# Patient Record
Sex: Male | Born: 1980 | Race: White | Hispanic: No | Marital: Married | Smoking: Never smoker
Health system: Southern US, Community
[De-identification: ages and names within clinical notes are randomized; demographics above are authoritative.]

## PROBLEM LIST (undated history)

## (undated) DIAGNOSIS — Q430 Meckel's diverticulum (displaced) (hypertrophic): Secondary | ICD-10-CM

## (undated) DIAGNOSIS — K56609 Unspecified intestinal obstruction, unspecified as to partial versus complete obstruction: Secondary | ICD-10-CM

## (undated) HISTORY — PX: LASIK: SHX215

## (undated) HISTORY — PX: ARTHROSCOPY KNEE W/ DRILLING: SUR92

---

## 2005-04-13 ENCOUNTER — Encounter: Admission: RE | Admit: 2005-04-13 | Discharge: 2005-04-13 | Payer: Self-pay | Admitting: Emergency Medicine

## 2009-12-18 ENCOUNTER — Encounter: Admission: RE | Admit: 2009-12-18 | Discharge: 2009-12-18 | Payer: Self-pay | Admitting: Family Medicine

## 2010-11-13 ENCOUNTER — Emergency Department (HOSPITAL_COMMUNITY)
Admission: EM | Admit: 2010-11-13 | Discharge: 2010-11-13 | Disposition: A | Discharge status: Home / Self Care | Payer: 59 | Attending: Emergency Medicine | Admitting: Emergency Medicine

## 2010-11-13 DIAGNOSIS — S01501A Unspecified open wound of lip, initial encounter: Secondary | ICD-10-CM | POA: Insufficient documentation

## 2010-11-13 DIAGNOSIS — S01119A Laceration without foreign body of unspecified eyelid and periocular area, initial encounter: Secondary | ICD-10-CM | POA: Insufficient documentation

## 2010-11-13 DIAGNOSIS — S0100XA Unspecified open wound of scalp, initial encounter: Secondary | ICD-10-CM | POA: Insufficient documentation

## 2010-11-13 DIAGNOSIS — IMO0002 Reserved for concepts with insufficient information to code with codable children: Secondary | ICD-10-CM | POA: Insufficient documentation

## 2010-11-13 DIAGNOSIS — Z23 Encounter for immunization: Secondary | ICD-10-CM | POA: Insufficient documentation

## 2010-11-13 DIAGNOSIS — Y9229 Other specified public building as the place of occurrence of the external cause: Secondary | ICD-10-CM | POA: Insufficient documentation

## 2011-11-26 ENCOUNTER — Ambulatory Visit: Payer: 59 | Admitting: Family Medicine

## 2011-11-26 VITALS — BP 118/82 | HR 46 | Temp 98.0°F | Resp 12 | Ht 68.5 in | Wt 225.6 lb

## 2011-11-26 DIAGNOSIS — R5381 Other malaise: Secondary | ICD-10-CM

## 2011-11-26 DIAGNOSIS — R110 Nausea: Secondary | ICD-10-CM

## 2011-11-26 LAB — POCT CBC
Granulocyte percent: 75.8 %G (ref 37–80)
Hemoglobin: 16.1 g/dL (ref 14.1–18.1)
MCH, POC: 29.2 pg (ref 27–31.2)
MCV: 90 fL (ref 80–97)
MID (cbc): 0.6 (ref 0–0.9)
MPV: 8 fL (ref 0–99.8)
POC LYMPH PERCENT: 16.9 %L (ref 10–50)
RBC: 5.52 M/uL (ref 4.69–6.13)
RDW, POC: 13.2 %

## 2011-11-26 LAB — POCT INFLUENZA A/B: Influenza B, POC: NEGATIVE

## 2011-11-26 MED ORDER — ONDANSETRON HCL 8 MG PO TABS: 8.0000 mg | ORAL_TABLET | Freq: Three times a day (TID) | ORAL | Status: AC | PRN

## 2011-11-26 NOTE — Progress Notes (Signed)
[facility] [address] [place] [address] [phone]  Date:  11/26/2011   Name:  KLAY SOBOTKA   DOB:  May 13, 1980   MRN:  [id number]  PCP:  No primary provider on file.    Chief Complaint: Generalized Body Aches, Emesis, Headache, Diarrhea, Sinusitis and Chills   History of Present Illness:  Luis Pineda is a 31 y.o. very pleasant male patient who presents with the following:  Today is Sunday- on Wednesday he developed body aches, chills and sweats.  He did vomit yesterday while at work.  Diarrhea yesterday and today.  Feels nauseated and it is hard to eat.   No cough or mucus from his nose, but he does note congestion and pressure/ frontal HA.  No ST.  He is afraid that he might have the flu.   No dysuria or other urinary symptoms.   His wife is 7 months pregnant.  He wants to be sure he is not contagious. They are expecting a baby girl.    Work in [employer].  He has never been a smoker.   Took ibuprofen last night but nothing today.    There is no problem list on file for this patient.   No past medical history on file.  No past surgical history on file.  History  Substance Use Topics  . Smoking status: Never Smoker   . Smokeless tobacco: Not on file  . Alcohol Use: Not on file    No family history on file.  No Known Allergies  Medication list has been reviewed and updated.  No current outpatient prescriptions on file prior to visit.    Review of Systems:  As per HPI- otherwise negative.   Physical Examination: Filed Vitals:   11/26/11 1315  BP: 118/82  Pulse: 46  Temp: 98 F (36.7 C)  Resp: 12   Filed Vitals:   11/26/11 1315  Height: 5' 8.5" (1.74 m)  Weight: 225 lb 9.6 oz (102.331 kg)   Body mass index is 33.80 kg/(m^2). Ideal Body Weight: Weight in (lb) to have BMI = 25: 166.5   GEN: WDWN, NAD, Non-toxic, A & O x 3, muscular build HEENT: Atraumatic, Normocephalic. Neck supple. No masses, No LAD.  TM and  oropharynx wnl, PEERL, EOMI.   Ears and Nose: No external deformity. CV: RRR, No M/G/R. No JVD. No thrill. No extra heart sounds. PULM: CTA B, no wheezes, crackles, rhonchi. No retractions. No resp. distress. No accessory muscle use. ABD: S, ND, +BS. No rebound. No HSM.  Mild TTP which is non- specific- is is diffuse over his entire abdomen but of note is not present in the RLQ EXTR: No c/c/e NEURO Normal gait.  PSYCH: Normally interactive. Conversant. Not depressed or anxious appearing.  Calm demeanor.   Results for orders placed in visit on 11/26/11  POCT CBC      Component Value Range   WBC 7.6  4.6 - 10.2 K/uL   Lymph, poc 1.3  0.6 - 3.4   POC LYMPH PERCENT 16.9  10 - 50 %L   MID (cbc) 0.6  0 - 0.9   POC MID % 7.3  0 - 12 %M   POC Granulocyte 5.8  2 - 6.9   Granulocyte percent 75.8  37 - 80 %G   RBC 5.52  4.69 - 6.13 M/uL   Hemoglobin 16.1  14.1 - 18.1 g/dL   HCT, POC 65.7  84.6 - 53.7 %  MCV 90.0  80 - 97 fL   MCH, POC 29.2  27 - 31.2 pg   MCHC 32.4  31.8 - 35.4 g/dL   RDW, POC 13.0     Platelet Count, POC 192  142 - 424 K/uL   MPV 8.0  0 - 99.8 fL  POCT INFLUENZA A/B      Component Value Range   Influenza A, POC Negative     Influenza B, POC Negative      Assessment and Plan: 1. Malaise  POCT CBC, POCT Influenza A/B  2. Nausea  ondansetron (ZOFRAN) 8 MG tablet   Malaise- likely a viral infection.  Does not seem to have influenza.  Zofran to use as needed for nausea.  Rest, fluids.  Let me know if not better in the next couple of days- Sooner if worse.     Abbe Amsterdam, MD

## 2015-03-10 ENCOUNTER — Ambulatory Visit (INDEPENDENT_AMBULATORY_CARE_PROVIDER_SITE_OTHER): Payer: 59 | Admitting: Urgent Care

## 2015-03-10 VITALS — BP 140/92 | HR 64 | Temp 97.5°F | Resp 18 | Ht 68.75 in | Wt 234.0 lb

## 2015-03-10 DIAGNOSIS — J302 Other seasonal allergic rhinitis: Secondary | ICD-10-CM | POA: Diagnosis not present

## 2015-03-10 DIAGNOSIS — J029 Acute pharyngitis, unspecified: Secondary | ICD-10-CM | POA: Diagnosis not present

## 2015-03-10 DIAGNOSIS — R0982 Postnasal drip: Secondary | ICD-10-CM

## 2015-03-10 DIAGNOSIS — R05 Cough: Secondary | ICD-10-CM

## 2015-03-10 DIAGNOSIS — R059 Cough, unspecified: Secondary | ICD-10-CM

## 2015-03-10 DIAGNOSIS — J329 Chronic sinusitis, unspecified: Secondary | ICD-10-CM

## 2015-03-10 DIAGNOSIS — J019 Acute sinusitis, unspecified: Secondary | ICD-10-CM | POA: Diagnosis not present

## 2015-03-10 MED ORDER — PSEUDOEPHEDRINE HCL ER 120 MG PO TB12: 120.0000 mg | ORAL_TABLET | Freq: Two times a day (BID) | ORAL | Status: DC

## 2015-03-10 MED ORDER — HYDROCODONE-HOMATROPINE 5-1.5 MG/5ML PO SYRP: 5.0000 mL | ORAL_SOLUTION | Freq: Every evening | ORAL | Status: DC | PRN

## 2015-03-10 MED ORDER — CETIRIZINE HCL 10 MG PO TABS: 10.0000 mg | ORAL_TABLET | Freq: Every day | ORAL | Status: DC

## 2015-03-10 MED ORDER — BENZONATATE 100 MG PO CAPS: 100.0000 mg | ORAL_CAPSULE | Freq: Three times a day (TID) | ORAL | Status: DC | PRN

## 2015-03-10 MED ORDER — AMOXICILLIN 875 MG PO TABS: 875.0000 mg | ORAL_TABLET | Freq: Two times a day (BID) | ORAL | Status: DC

## 2015-03-10 NOTE — Progress Notes (Signed)
    MRN: [id number] DOB: 10-May-1980  Subjective:   Luis Pineda is a 34 y.o. male presenting for chief complaint of Generalized Body Aches; Nasal Congestion; and Cough  Reports 4 day history of body aches, chills, sinus congestion, sinus pressure, hoarseness, productive cough worse at night, neck pain, intermittent loose stools. Has tried NyQuil, DayQuil and Mucinex with minimal relief. Of note, patient has 2 children and a wife that have been intermittently ill, ear infections over the past 4 weeks. Patient also works with [employer] and has been on 80 hour weeks since 01/2015. Denies fever, itchy or red eyes, sinus pain, ear pain, ear drainage, sore throat, tooth pain, chest pain, shob, chest tightness, n/v, abdominal pain. Has history of seasonal allergies (Spring), takes antihistamines for this. Denies history of asthma, smoking cigarettes. Family history is negative for diabetes, heart disease, cancer.  Abdulsalam currently has no medications in their medication list. Also has No Known Allergies.  Con  has no past medical history on file. Also  has no past surgical history on file.  Objective:   Vitals: BP 140/92 mmHg  Pulse 64  Temp(Src) 97.5 F (36.4 C) (Oral)  Resp 18  Ht 5' 8.75" (1.746 m)  Wt 234 lb (106.142 kg)  BMI 34.82 kg/m2  SpO2 98%  Physical Exam  Constitutional: He is oriented to person, place, and time. He appears well-developed and well-nourished.  HENT:  TM's flat bilaterally, no effusions or erythema. Nasal turbinates boggy with slight yellow mucus. Bilateral maxillary sinus tenderness. Moderate postnasal drip present, without oropharyngeal exudates, erythema or abscesses.  Eyes: Right eye exhibits no discharge. Left eye exhibits no discharge. No scleral icterus.  Neck: Normal range of motion. Neck supple.  Cardiovascular: Normal rate, regular rhythm and intact distal pulses.  Exam reveals no gallop and no friction rub.   No murmur heard. Pulmonary/Chest: No  respiratory distress. He has no wheezes. He has no rales.  Lymphadenopathy:    He has cervical adenopathy (bilateral, anterior).  Neurological: He is alert and oriented to person, place, and time.  Skin: Skin is warm and dry. No rash noted. No erythema. No pallor.   Assessment and Plan :   1. Acute sinusitis, recurrence not specified, unspecified location 2. Sore throat 3. Cough - Patient likely undergoing viral sinusitis due to lack of rest and exposure to multiple sick contacts. I advised supportive care which the patient did not agree with. However, I offered patient a printed script for amoxicillin in case he has no improvement with supportive care including Hycodan, Tessalon, Sudafed, otc NSAID. I offered patient note for work but stated that he cannot rest/take sick days from work. He is going to wait 3-4 days before filling Amoxicillin.  - Counseled patient on risks of antibiotic use in setting of loose stools. Patient verbalized understanding.  4. Post-nasal drainage 5. Seasonal allergies - Start Zyrtec, may also use Sudafed.  Wallis Bamberg, PA-C [facility] [phone] 03/10/2015 10:24 AM

## 2015-03-10 NOTE — Patient Instructions (Signed)

## 2015-08-20 DIAGNOSIS — K56609 Unspecified intestinal obstruction, unspecified as to partial versus complete obstruction: Secondary | ICD-10-CM

## 2015-08-20 HISTORY — DX: Unspecified intestinal obstruction, unspecified as to partial versus complete obstruction: K56.609

## 2015-08-28 ENCOUNTER — Other Ambulatory Visit: Payer: Self-pay | Admitting: Family Medicine

## 2015-08-28 ENCOUNTER — Inpatient Hospital Stay: Admit: 2015-08-28 | Payer: Self-pay | Admitting: Diagnostic Radiology

## 2015-08-28 ENCOUNTER — Inpatient Hospital Stay (HOSPITAL_COMMUNITY)
Admission: AD | Admit: 2015-08-28 | Discharge: 2015-09-01 | DRG: 386 | Disposition: A | Discharge status: Home / Self Care | Payer: 59 | Source: Ambulatory Visit | Attending: Family Medicine | Admitting: Family Medicine

## 2015-08-28 ENCOUNTER — Encounter (HOSPITAL_COMMUNITY): Payer: Self-pay | Admitting: Family Medicine

## 2015-08-28 ENCOUNTER — Inpatient Hospital Stay
Admission: AD | Admit: 2015-08-28 | Discharge: 2015-08-28 | Disposition: A | Discharge status: Home / Self Care | Payer: 59 | Source: Ambulatory Visit | Attending: Family Medicine | Admitting: Family Medicine

## 2015-08-28 DIAGNOSIS — K5090 Crohn's disease, unspecified, without complications: Secondary | ICD-10-CM | POA: Diagnosis present

## 2015-08-28 DIAGNOSIS — K529 Noninfective gastroenteritis and colitis, unspecified: Secondary | ICD-10-CM | POA: Diagnosis present

## 2015-08-28 DIAGNOSIS — R112 Nausea with vomiting, unspecified: Secondary | ICD-10-CM | POA: Diagnosis present

## 2015-08-28 DIAGNOSIS — R1031 Right lower quadrant pain: Secondary | ICD-10-CM

## 2015-08-28 DIAGNOSIS — K50019 Crohn's disease of small intestine with unspecified complications: Secondary | ICD-10-CM | POA: Diagnosis not present

## 2015-08-28 DIAGNOSIS — K50012 Crohn's disease of small intestine with intestinal obstruction: Principal | ICD-10-CM | POA: Diagnosis present

## 2015-08-28 DIAGNOSIS — K50912 Crohn's disease, unspecified, with intestinal obstruction: Secondary | ICD-10-CM | POA: Diagnosis not present

## 2015-08-28 DIAGNOSIS — G43A0 Cyclical vomiting, not intractable: Secondary | ICD-10-CM | POA: Diagnosis not present

## 2015-08-28 DIAGNOSIS — R1084 Generalized abdominal pain: Secondary | ICD-10-CM

## 2015-08-28 DIAGNOSIS — Z6831 Body mass index (BMI) 31.0-31.9, adult: Secondary | ICD-10-CM

## 2015-08-28 DIAGNOSIS — Z8719 Personal history of other diseases of the digestive system: Secondary | ICD-10-CM | POA: Diagnosis present

## 2015-08-28 DIAGNOSIS — E669 Obesity, unspecified: Secondary | ICD-10-CM | POA: Diagnosis present

## 2015-08-28 DIAGNOSIS — K56609 Unspecified intestinal obstruction, unspecified as to partial versus complete obstruction: Secondary | ICD-10-CM | POA: Diagnosis present

## 2015-08-28 DIAGNOSIS — K5669 Other intestinal obstruction: Secondary | ICD-10-CM | POA: Diagnosis not present

## 2015-08-28 HISTORY — DX: Unspecified intestinal obstruction, unspecified as to partial versus complete obstruction: K56.609

## 2015-08-28 LAB — CBC WITH DIFFERENTIAL/PLATELET
BASOS ABS: 0 10*3/uL (ref 0.0–0.1)
BASOS PCT: 0 %
EOS ABS: 0.1 10*3/uL (ref 0.0–0.7)
EOS PCT: 1 %
HCT: 43.8 % (ref 39.0–52.0)
Hemoglobin: 14.8 g/dL (ref 13.0–17.0)
Lymphocytes Relative: 26 %
Lymphs Abs: 1.8 10*3/uL (ref 0.7–4.0)
MCH: 28.7 pg (ref 26.0–34.0)
MCHC: 33.8 g/dL (ref 30.0–36.0)
MCV: 84.9 fL (ref 78.0–100.0)
MONO ABS: 0.8 10*3/uL (ref 0.1–1.0)
Monocytes Relative: 11 %
NEUTROS ABS: 4.2 10*3/uL (ref 1.7–7.7)
Neutrophils Relative %: 62 %
PLATELETS: 219 10*3/uL (ref 150–400)
RBC: 5.16 MIL/uL (ref 4.22–5.81)
RDW: 12.3 % (ref 11.5–15.5)
WBC: 6.8 10*3/uL (ref 4.0–10.5)

## 2015-08-28 LAB — COMPREHENSIVE METABOLIC PANEL
ALBUMIN: 3.4 g/dL — AB (ref 3.5–5.0)
ALT: 14 U/L — ABNORMAL LOW (ref 17–63)
ANION GAP: 5 (ref 5–15)
AST: 14 U/L — AB (ref 15–41)
Alkaline Phosphatase: 65 U/L (ref 38–126)
BUN: 9 mg/dL (ref 6–20)
CHLORIDE: 101 mmol/L (ref 101–111)
CO2: 27 mmol/L (ref 22–32)
Calcium: 9.1 mg/dL (ref 8.9–10.3)
Creatinine, Ser: 1.28 mg/dL — ABNORMAL HIGH (ref 0.61–1.24)
GFR calc Af Amer: >60 mL/min (ref >60)
GFR calc non Af Amer: >60 mL/min (ref >60)
GLUCOSE: 119 mg/dL — AB (ref 65–99)
POTASSIUM: 3.3 mmol/L — AB (ref 3.5–5.1)
SODIUM: 133 mmol/L — AB (ref 135–145)
Total Bilirubin: 0.4 mg/dL (ref 0.3–1.2)
Total Protein: 6.3 g/dL — ABNORMAL LOW (ref 6.5–8.1)

## 2015-08-28 LAB — URINALYSIS, ROUTINE W REFLEX MICROSCOPIC
Bilirubin Urine: NEGATIVE
Glucose, UA: NEGATIVE mg/dL
Hgb urine dipstick: NEGATIVE
KETONES UR: NEGATIVE mg/dL
LEUKOCYTES UA: NEGATIVE
NITRITE: NEGATIVE
PH: 6 (ref 5.0–8.0)
Protein, ur: NEGATIVE mg/dL
SPECIFIC GRAVITY, URINE: 1.029 (ref 1.005–1.030)

## 2015-08-28 LAB — LACTIC ACID, PLASMA: Lactic Acid, Venous: 0.8 mmol/L (ref 0.5–2.0)

## 2015-08-28 LAB — SEDIMENTATION RATE: Sed Rate: 14 mm/hr (ref 0–16)

## 2015-08-28 LAB — C-REACTIVE PROTEIN: CRP: 1.5 mg/dL — ABNORMAL HIGH (ref <1.0)

## 2015-08-28 LAB — TSH: TSH: 0.662 u[IU]/mL (ref 0.350–4.500)

## 2015-08-28 MED ORDER — SODIUM CHLORIDE 0.9 % IV SOLN
INTRAVENOUS | Status: DC
  Administered 2015-08-28 – 2015-08-30 (×6): via INTRAVENOUS

## 2015-08-28 MED ORDER — PIPERACILLIN-TAZOBACTAM 3.375 G IVPB
3.3750 g | Freq: Three times a day (TID) | INTRAVENOUS | Status: DC
Start: 2015-08-28 — End: 2015-09-01
  Administered 2015-08-28 – 2015-09-01 (×12): 3.375 g via INTRAVENOUS
  Filled 2015-08-28 (×14): qty 50

## 2015-08-28 MED ORDER — CETYLPYRIDINIUM CHLORIDE 0.05 % MT LIQD
7.0000 mL | Freq: Two times a day (BID) | OROMUCOSAL | Status: DC
  Administered 2015-08-29 – 2015-08-30 (×4): 7 mL via OROMUCOSAL

## 2015-08-28 MED ORDER — IOPAMIDOL (ISOVUE-300) INJECTION 61%
125.0000 mL | Freq: Once | INTRAVENOUS | Status: AC | PRN
  Administered 2015-08-28: 125 mL via INTRAVENOUS

## 2015-08-28 MED ORDER — MORPHINE SULFATE (PF) 2 MG/ML IV SOLN
2.0000 mg | INTRAVENOUS | Status: DC | PRN
  Administered 2015-08-28 – 2015-08-30 (×5): 2 mg via INTRAVENOUS
  Filled 2015-08-28 (×7): qty 1

## 2015-08-28 MED ORDER — CIPROFLOXACIN IN D5W 400 MG/200ML IV SOLN
400.0000 mg | Freq: Two times a day (BID) | INTRAVENOUS | Status: DC
  Filled 2015-08-28 (×2): qty 200

## 2015-08-28 MED ORDER — ONDANSETRON HCL 4 MG PO TABS: 4.0000 mg | ORAL_TABLET | Freq: Four times a day (QID) | ORAL | Status: DC | PRN

## 2015-08-28 MED ORDER — ONDANSETRON HCL 4 MG/2ML IJ SOLN: 4.0000 mg | Freq: Four times a day (QID) | INTRAMUSCULAR | Status: DC | PRN

## 2015-08-28 MED ORDER — METRONIDAZOLE IN NACL 5-0.79 MG/ML-% IV SOLN
500.0000 mg | Freq: Three times a day (TID) | INTRAVENOUS | Status: DC
  Filled 2015-08-28 (×2): qty 100

## 2015-08-28 NOTE — H&P (Signed)
History and Physical  Luis Pineda TGP:[id number] DOB: Apr 21, 1980 DOA: 08/28/2015  Referring physician: Thora Lance, MD PCP: Thora Lance, MD   Chief Complaint: abdominal pain  HPI: Luis Pineda is a 35 y.o. male with no known significant PMH presents as direct admit from PCP office with diagnosis of abdominal pain and SBO.  The patient reports having progressively worsening abdominal pain for past 2 weeks.  He has been having intermittent sharp, crampy mid-epigastric and RLQ pain that forced him to leave work and go to the doctor.  His symptoms started with bloating and mild pain that he thought was related to constipation. He was taking laxatives at home but symptoms not getting better. He had bowel movements up until the last couple of days and then was not able to have a bowel movement.  He began to have nausea and vomiting in last 24 hours. He was seen by PCP today and got a CT scan of the abdomen revealing small bowel obstruction and suspicion of enteritis.  He is being admitted for further evaluation and management.   Review of Systems: All systems reviewed and apart from history of presenting illness, are negative.  History reviewed. No pertinent past medical history. Past Surgical History  Procedure Laterality Date  . Lasik    . Arthroscopy knee w/ drilling Right    Social History:  reports that he has never smoked. He does not have any smokeless tobacco history on file. His alcohol and drug histories are not on file.   No Known Allergies  Family History  Problem Relation Age of Onset  . GI problems     Prior to Admission medications   Not on File  Denies taking any medications at this time.   Physical Exam: Filed Vitals:   08/28/15 1622  BP: 140/88  Pulse: 80  Temp: 98.1 F (36.7 C)  TempSrc: Oral  Resp: 18  Height: 5\' 10"  (1.778 m)  Weight: 218 lb (98.884 kg)  SpO2: 99%     General exam: well developed and nourished patient, lying comfortably  supine on the bed in no obvious distress but does appear ill.  Head, eyes and ENT: Nontraumatic and normocephalic. Pupils equally reacting to light and accommodation. Oral mucosa dry.  Neck: Supple. No JVD, carotid bruit or thyromegaly.  Lymphatics: No lymphadenopathy.  Respiratory system: Clear to auscultation. No increased work of breathing.  Cardiovascular system: S1 and S2 heard, RRR. No JVD, murmurs, gallops, clicks or pedal edema.  Gastrointestinal system: Abdomen is nondistended but very tender to palpation in RLQ and epigastric areas, some tenderness noted in LLQ and hypoactive bowel sounds heard. No organomegaly or masses appreciated.  Central nervous system: Alert and oriented. No focal neurological deficits.  Extremities: Symmetric 5 x 5 power. Peripheral pulses symmetrically felt.   Skin: No rashes or acute findings.  Musculoskeletal system: Negative exam.  Psychiatry: Pleasant and cooperative.  Labs on Admission:  Basic Metabolic Panel: No results for input(s): NA, K, CL, CO2, GLUCOSE, BUN, CREATININE, CALCIUM, MG, PHOS in the last 168 hours. Liver Function Tests: No results for input(s): AST, ALT, ALKPHOS, BILITOT, PROT, ALBUMIN in the last 168 hours. No results for input(s): LIPASE, AMYLASE in the last 168 hours. No results for input(s): AMMONIA in the last 168 hours. CBC: No results for input(s): WBC, NEUTROABS, HGB, HCT, MCV, PLT in the last 168 hours. Cardiac Enzymes: No results for input(s): CKTOTAL, CKMB, CKMBINDEX, TROPONINI in the last 168 hours.  BNP (last 3  results) No results for input(s): PROBNP in the last 8760 hours. CBG: No results for input(s): GLUCAP in the last 168 hours.  Radiological Exams on Admission: Ct Abdomen Pelvis W Contrast  08/28/2015  CLINICAL DATA:  Epigastric pain EXAM: CT ABDOMEN AND PELVIS WITH CONTRAST TECHNIQUE: Multidetector CT imaging of the abdomen and pelvis was performed using the standard protocol following bolus  administration of intravenous contrast. CONTRAST:  ISOVUE-300 IOPAMIDOL (ISOVUE-300) INJECTION 61% COMPARISON:  12/18/2009 FINDINGS: Lower chest: The lung bases are clear. No pleural or pericardial effusion noted. Hepatobiliary: No suspicious liver abnormalities identified. The gallbladder appears normal. There is no biliary dilatation. Pancreas: No mass, inflammatory changes, or other significant abnormality. Spleen: Within normal limits in size and appearance. Adrenals/Urinary Tract: The adrenal glands are normal. Unremarkable appearance of both kidneys. The urinary bladder is within normal limits. Stomach/Bowel: The stomach appears normal. The proximal small bowel loops are unremarkable. There is increase caliber of the mid small bowel loops which measure up to 3.5 cm. Transition point is within the central abdomen where there is an abnormal loop of small bowel which exhibits abnormal mucosal enhanced and wall thickening with surrounding inflammation/ fat stranding. There is associated luminal narrowing. The affected right lower quadrant bowel loops appeared tethered, image 74 series 2. 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 Small lymph nodes are identified within the surrounding mesenteric fat. Bowel wall thickness measures up to 8 mm, image 34 of series 3. Decreased caliber of the terminal ileum. Unremarkable appearance of the colon. Vascular/Lymphatic: Normal appearance of the abdominal aorta. No enlarged retroperitoneal or mesenteric adenopathy. No enlarged pelvic or inguinal lymph nodes. Reproductive: No mass or other significant abnormality. Other: There is a moderate amount of free fluid identified within the pelvis. At this time there are no fluid collections which would be amendable to drainage. Musculoskeletal: There is a well defined sclerotic focus within the right iliac bone. This  has a nonaggressive appearance and likely reflects benign abnormality.   IMPRESSION: 1. Small bowel obstruction. The transition point is within the right lower quadrant of the abdomen, likely mid to distal ileum. 2. Abnormal appearance of right lower quadrant small bowel loops which exhibit wall thickening, mucosal enhanced, luminal narrowing, and surrounding inflammation. Findings are worrisome for scratch set findings are compatible with enteritis. This may be inflammatory or infectious in etiology. Crohn's disease is not excluded. 3. Extraluminal collection of gas and phlegmon with surrounding inflammation is identified within the right lower quadrant and is adjacent to the abnormal appearing small bowel loops. Although this may reflect bowel perforation, in the setting of inflammatory bowel disease this would be indicative of penetrating disease. These results were called by telephone at the time of interpretation on 08/28/2015 at 2:17 pm to Dr. Blair Heys , who verbally acknowledged these results. Electronically Signed   By: Signa Kell M.D.   On: 08/28/2015 14:19   Assessment/Plan Principal Problem:   SBO (small bowel obstruction) (HCC) Active Problems:   Generalized abdominal pain   RLQ abdominal pain   Enteritis   Nausea and vomiting  1. Small Bowel Obstruction - Pt is being admitted to medical bed and made NPO.  He will be started on IVF hydration.  IV pain and nausea meds as needed.  Consult to general surgery being made.  Place NG tube to low wall suction.  Bowel Rest.  Further recommendations pending surgery consult.  Zosyn IV ordered.  2. Enteritis - concerning for an infectious versus inflammatory etiology - ordered to check CBC with diff, lactic acid, CMP, sed rate, CRP and will empirically start antibiotics pending further tests.  Check blood cultures.  I called and requested a GI consult with [facility].   3. Nausea and voming - NG tube placed, IV nausea medications ordered as  needed.    Time spent: 50 mins  Standley Dakins, MD [facility] Pager [phone]  If 7PM-7AM, please contact night-coverage [email or web address] Password [email or web address] 08/28/2015, 5:07 PM

## 2015-08-28 NOTE — Progress Notes (Signed)
Luis Pineda is a 35 y.o. male patient admitted from ED awake, alert - oriented  X 4 - no acute distress noted.  VSS - Blood pressure 140/88, pulse 80, temperature 98.1 F (36.7 C), temperature source Oral, resp. rate 18, height 5\' 10"  (1.778 m), weight 98.884 kg (218 lb), SpO2 99 %.    IV in place, occlusive dsg intact without redness.  Orientation to room, and floor completed.  Admission INP armband ID verified with patient/family, and in place.   SR up x 2, fall assessment complete, with patient and family able to verbalize understanding of risk associated with falls, and verbalized understanding to call nsg before up out of bed.  Call light within reach, patient able to voice, and demonstrate understanding.  Skin, clean-dry- intact without evidence of bruising, or skin tears.   No evidence of skin break down noted on exam.     Will cont to eval and treat per MD orders.  Alonza Bogus, RN 08/28/2015 4:32 PM

## 2015-08-28 NOTE — Progress Notes (Signed)
Direct Admit Signout Note  08/28/2015 71:94 PM  35 year old gentleman with no significant PMH being direct admitted from Dr. Randel Books office with persistent abdominal pain.  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  He is noted to be nontoxic and vitals stable in office.  He needs GI and gen surgery consult.  Admitted to med-surg bed.    Maryln Manuel, MD

## 2015-08-28 NOTE — Consult Note (Signed)
Reason for Consult:SBO, possible Crohn's Referring Physician: Clanford Zakir Pineda is an 35 y.o. male.  HPI: Luis Pineda has been having abdominal pain with bloating for approximately 2 weeks. It gradually got worse. He developed nausea and vomiting yesterday evening. Bowel movements have been daily but very small after straining. No blood in his stool. No recent hematochezia or mucus in stools. He was evaluated by an urgent care facility and referred for CT scan of the abdomen and pelvis as an outpatient. Results demonstrated small bowel obstruction with terminal ileitis and likely localized microperforation. He was referred for direct admission to the hospitalist service by Dr. Jeb Pineda, his primary care physician.  Interestingly, Luis Pineda reports a similar episode, though not as severe in 2011. He underwent CT scan evaluation at that time which he reports was unremarkable.  Past Medical History  Diagnosis Date  . SBO (small bowel obstruction) ([facility]) 08/2015    Past Surgical History  Procedure Laterality Date  . Lasik    . Arthroscopy knee w/ drilling Right     Family History  Problem Relation Age of Onset  . GI problems      Social History:  reports that he has never smoked. He has never used smokeless tobacco. He reports that he drinks alcohol. He reports that he does not use illicit drugs. He works as a Librarian, academic for [employer] and is married with 2 young children  Allergies: No Known Allergies  Medications:  Continuous: . sodium chloride      Results for orders placed or performed during the hospital encounter of 08/28/15 (from the past 48 hour(s))  CBC with Differential/Platelet     Status: None   Collection Time: 08/28/15  5:02 PM  Result Value Ref Range   WBC 6.8 4.0 - 10.5 K/uL   RBC 5.16 4.22 - 5.81 MIL/uL   Hemoglobin 14.8 13.0 - 17.0 g/dL   HCT 43.8 39.0 - 52.0 %   MCV 84.9 78.0 - 100.0 fL   MCH 28.7 26.0 - 34.0 pg   MCHC 33.8 30.0 - 36.0 g/dL   RDW 12.3  11.5 - 15.5 %   Platelets 219 150 - 400 K/uL   Neutrophils Relative % 62 %   Neutro Abs 4.2 1.7 - 7.7 K/uL   Lymphocytes Relative 26 %   Lymphs Abs 1.8 0.7 - 4.0 K/uL   Monocytes Relative 11 %   Monocytes Absolute 0.8 0.1 - 1.0 K/uL   Eosinophils Relative 1 %   Eosinophils Absolute 0.1 0.0 - 0.7 K/uL   Basophils Relative 0 %   Basophils Absolute 0.0 0.0 - 0.1 K/uL  Comprehensive metabolic panel     Status: Abnormal   Collection Time: 08/28/15  5:02 PM  Result Value Ref Range   Sodium 133 (L) 135 - 145 mmol/L   Potassium 3.3 (L) 3.5 - 5.1 mmol/L   Chloride 101 101 - 111 mmol/L   CO2 27 22 - 32 mmol/L   Glucose, Bld 119 (H) 65 - 99 mg/dL   BUN 9 6 - 20 mg/dL   Creatinine, Ser 1.28 (H) 0.61 - 1.24 mg/dL   Calcium 9.1 8.9 - 10.3 mg/dL   Total Protein 6.3 (L) 6.5 - 8.1 g/dL   Albumin 3.4 (L) 3.5 - 5.0 g/dL   AST 14 (L) 15 - 41 U/L   ALT 14 (L) 17 - 63 U/L   Alkaline Phosphatase 65 38 - 126 U/L   Total Bilirubin 0.4 0.3 - 1.2 mg/dL  GFR calc non Af Amer >60 >60 mL/min   GFR calc Af Amer >60 >60 mL/min    Comment: (NOTE) The eGFR has been calculated using the CKD EPI equation. This calculation has not been validated in all clinical situations. eGFR's persistently <60 mL/min signify possible Chronic Kidney Disease.    Anion gap 5 5 - 15  Sedimentation rate     Status: None   Collection Time: 08/28/15  5:02 PM  Result Value Ref Range   Sed Rate 14 0 - 16 mm/hr    Ct Abdomen Pelvis W Contrast  08/28/2015  CLINICAL DATA:  Epigastric pain EXAM: CT ABDOMEN AND PELVIS WITH CONTRAST TECHNIQUE: Multidetector CT imaging of the abdomen and pelvis was performed using the standard protocol following bolus administration of intravenous contrast. CONTRAST:  14m ISOVUE-300 IOPAMIDOL (ISOVUE-300) INJECTION 61% COMPARISON:  12/18/2009 FINDINGS: Lower chest: The lung bases are clear. No pleural or pericardial effusion noted. Hepatobiliary: No suspicious liver abnormalities identified. The  gallbladder appears normal. There is no biliary dilatation. Pancreas: No mass, inflammatory changes, or other significant abnormality. Spleen: Within normal limits in size and appearance. Adrenals/Urinary Tract: The adrenal glands are normal. Unremarkable appearance of both kidneys. The urinary bladder is within normal limits. Stomach/Bowel: The stomach appears normal. The proximal small bowel loops are unremarkable. There is increase caliber of the mid small bowel loops which measure up to 3.5 cm. Transition point is within the central abdomen where there is an abnormal loop of small bowel which exhibits abnormal mucosal enhanced and wall thickening with surrounding inflammation/ fat stranding. There is associated luminal narrowing. The affected right lower quadrant bowel loops appeared tethered, image 74 series 2. 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 Small lymph nodes are identified within the surrounding mesenteric fat. Bowel wall thickness measures up to 8 mm, image 34 of series 3. Decreased caliber of the terminal ileum. Unremarkable appearance of the colon. Vascular/Lymphatic: Normal appearance of the abdominal aorta. No enlarged retroperitoneal or mesenteric adenopathy. No enlarged pelvic or inguinal lymph nodes. Reproductive: No mass or other significant abnormality. Other: There is a moderate amount of free fluid identified within the pelvis. At this time there are no fluid collections which would be amendable to drainage. Musculoskeletal: There is a well defined sclerotic focus within the right iliac bone. This has a nonaggressive appearance and likely reflects benign abnormality. IMPRESSION: 1. Small bowel obstruction. The transition point is within the right lower quadrant of the abdomen, likely mid to distal ileum. 2. Abnormal appearance of right lower quadrant small bowel loops which  exhibit wall thickening, mucosal enhanced, luminal narrowing, and surrounding inflammation. Findings are worrisome for scratch set findings are compatible with enteritis. This may be inflammatory or infectious in etiology. Crohn's disease is not excluded. 3. Extraluminal collection of gas and phlegmon with surrounding inflammation is identified within the right lower quadrant and is adjacent to the abnormal appearing small bowel loops. Although this may reflect bowel perforation, in the setting of inflammatory bowel disease this would be indicative of penetrating disease. These results were called by telephone at the time of interpretation on 08/28/2015 at 2:17 pm to Dr. RGaynelle Arabian, who verbally acknowledged these results. Electronically Signed   By: TKerby MoorsM.D.   On: 08/28/2015 14:19    Review of Systems  Constitutional: Negative for fever and chills.  HENT: Negative.   Eyes: Negative for blurred vision.  Respiratory: Negative for cough and shortness of breath.   Cardiovascular: Negative for chest pain.  Gastrointestinal: Positive for nausea, vomiting, abdominal pain and constipation. Negative for diarrhea, blood in stool and melena.  Genitourinary: Negative.   Musculoskeletal: Negative.   Skin: Negative.   Neurological: Negative.   Psychiatric/Behavioral: Negative.    Blood pressure 140/88, pulse 80, temperature 98.1 F (36.7 C), temperature source Oral, resp. rate 18, height '5\' 10"'  (1.778 m), weight 98.884 kg (218 lb), SpO2 99 %. Physical Exam  Constitutional: He is oriented to person, place, and time. He appears well-developed and well-nourished. No distress.  HENT:  Head: Normocephalic and atraumatic.  Right Ear: External ear normal.  Left Ear: External ear normal.  Nose: Nose normal.  Mouth/Throat: Oropharynx is clear and moist.  Eyes: EOM are normal. Pupils are equal, round, and reactive to light. Right eye exhibits no discharge. Left eye exhibits no discharge.  Neck:  Neck supple. No tracheal deviation present.  Cardiovascular: Normal rate, normal heart sounds and intact distal pulses.   Respiratory: Effort normal and breath sounds normal. No stridor. No respiratory distress. He has no wheezes. He has no rales.  GI: Soft. He exhibits no distension. There is tenderness. There is no rebound and no guarding.  Hypoactive bowel sounds, tender in the right lower quadrant and left side without guarding, no peritonitis  Musculoskeletal: Normal range of motion. He exhibits no edema or tenderness.  Neurological: He is alert and oriented to person, place, and time. He exhibits normal muscle tone.  Skin: Skin is warm and dry.  Psychiatric: He has a normal mood and affect.    Assessment/Plan: Small bowel obstruction likely due to to Crohn's disease with terminal ileitis and microperforation - agree with IV fluids, Zosyn, bowel rest. Agree with NG tube if he continues to vomit. He needs a gastroenterology consultation for further workup of inflammatory bowel disease. No emergent surgery needed at this time, but we will follow closely. I discussed this with Dr. Wynetta Emery.  Eulises Kijowski E 08/28/2015, 6:24 PM

## 2015-08-29 LAB — CBC
HCT: 41.8 % (ref 39.0–52.0)
HEMOGLOBIN: 13.9 g/dL (ref 13.0–17.0)
MCH: 28.4 pg (ref 26.0–34.0)
MCHC: 33.3 g/dL (ref 30.0–36.0)
MCV: 85.3 fL (ref 78.0–100.0)
Platelets: 213 10*3/uL (ref 150–400)
RBC: 4.9 MIL/uL (ref 4.22–5.81)
RDW: 12.4 % (ref 11.5–15.5)
WBC: 7 10*3/uL (ref 4.0–10.5)

## 2015-08-29 LAB — COMPREHENSIVE METABOLIC PANEL
ALBUMIN: 3.1 g/dL — AB (ref 3.5–5.0)
ALK PHOS: 55 U/L (ref 38–126)
ALT: 11 U/L — ABNORMAL LOW (ref 17–63)
ANION GAP: 7 (ref 5–15)
AST: 12 U/L — ABNORMAL LOW (ref 15–41)
BILIRUBIN TOTAL: 0.6 mg/dL (ref 0.3–1.2)
BUN: 8 mg/dL (ref 6–20)
CALCIUM: 8.8 mg/dL — AB (ref 8.9–10.3)
CO2: 27 mmol/L (ref 22–32)
Chloride: 106 mmol/L (ref 101–111)
Creatinine, Ser: 1.32 mg/dL — ABNORMAL HIGH (ref 0.61–1.24)
GFR calc Af Amer: >60 mL/min (ref >60)
GLUCOSE: 92 mg/dL (ref 65–99)
POTASSIUM: 3.7 mmol/L (ref 3.5–5.1)
Sodium: 140 mmol/L (ref 135–145)
TOTAL PROTEIN: 5.9 g/dL — AB (ref 6.5–8.1)

## 2015-08-29 MED ORDER — METHYLPREDNISOLONE SODIUM SUCC 125 MG IJ SOLR
60.0000 mg | Freq: Two times a day (BID) | INTRAMUSCULAR | Status: DC
  Administered 2015-08-29 – 2015-08-31 (×5): 60 mg via INTRAVENOUS
  Filled 2015-08-29 (×5): qty 2

## 2015-08-29 NOTE — Progress Notes (Signed)
[facility] Progress Note  08/29/2015  Subjective: Pt reports no change in abd pain but not vomiting. Did not have to get NG tube.   Objective:  Vital signs in last 24 hours: Filed Vitals:   08/28/15 1622 08/28/15 2142 08/29/15 0539  BP: 140/88 135/71 110/54  Pulse: 80 84 52  Temp: 98.1 F (36.7 C) 98.3 F (36.8 C) 97.6 F (36.4 C)  TempSrc: Oral Oral Oral  Resp: 18 17 17   Height: 5\' 10"  (1.778 m)    Weight: 218 lb (98.884 kg)    SpO2: 99% 98% 98%   Weight change:   Intake/Output Summary (Last 24 hours) at 08/29/15 1227 Last data filed at 08/29/15 0900  Gross per 24 hour  Intake 1489.17 ml  Output    250 ml  Net 1239.17 ml   No results found for: HGBA1C Lab Results  Component Value Date   CREATININE 1.32* 08/29/2015    Review of Systems As above, otherwise all reviewed and reported negative  Physical Exam  General exam: well developed and nourished patient, lying comfortably supine on the bed in no obvious distress but does appear ill.  Head, eyes and ENT: Nontraumatic and normocephalic. Pupils equally reacting to light and accommodation. Oral mucosa dry.  Neck: Supple. No JVD, carotid bruit or thyromegaly.  Lymphatics: No lymphadenopathy.  Respiratory system: Clear to auscultation. No increased work of breathing.  Cardiovascular system: S1 and S2 heard, RRR. No JVD, murmurs, gallops, clicks or pedal edema.  Gastrointestinal system: Abdomen is nondistended but very tender to palpation in RLQ and epigastric areas, some tenderness noted in LLQ and hypoactive bowel sounds heard. No organomegaly or masses appreciated.  Central nervous system: Alert and oriented. No focal neurological deficits.  Extremities: Symmetric 5 x 5 power. Peripheral pulses symmetrically felt.   Skin: No rashes or acute findings.  Musculoskeletal system: Negative exam.  Psychiatry: Pleasant and cooperative.  Lab Results: Results for orders placed or performed during the  hospital encounter of 08/28/15 (from the past 24 hour(s))  CBC with Differential/Platelet     Status: None   Collection Time: 08/28/15  5:02 PM  Result Value Ref Range   WBC 6.8 4.0 - 10.5 K/uL   RBC 5.16 4.22 - 5.81 MIL/uL   Hemoglobin 14.8 13.0 - 17.0 g/dL   HCT 16.1 09.6 - 04.5 %   MCV 84.9 78.0 - 100.0 fL   MCH 28.7 26.0 - 34.0 pg   MCHC 33.8 30.0 - 36.0 g/dL   RDW 40.9 81.1 - 91.4 %   Platelets 219 150 - 400 K/uL   Neutrophils Relative % 62 %   Neutro Abs 4.2 1.7 - 7.7 K/uL   Lymphocytes Relative 26 %   Lymphs Abs 1.8 0.7 - 4.0 K/uL   Monocytes Relative 11 %   Monocytes Absolute 0.8 0.1 - 1.0 K/uL   Eosinophils Relative 1 %   Eosinophils Absolute 0.1 0.0 - 0.7 K/uL   Basophils Relative 0 %   Basophils Absolute 0.0 0.0 - 0.1 K/uL  Comprehensive metabolic panel     Status: Abnormal   Collection Time: 08/28/15  5:02 PM  Result Value Ref Range   Sodium 133 (L) 135 - 145 mmol/L   Potassium 3.3 (L) 3.5 - 5.1 mmol/L   Chloride 101 101 - 111 mmol/L   CO2 27 22 - 32 mmol/L   Glucose, Bld 119 (H) 65 - 99 mg/dL   BUN 9 6 - 20 mg/dL   Creatinine, Ser 7.82 (H) 0.61 -  1.24 mg/dL   Calcium 9.1 8.9 - 57.8 mg/dL   Total Protein 6.3 (L) 6.5 - 8.1 g/dL   Albumin 3.4 (L) 3.5 - 5.0 g/dL   AST 14 (L) 15 - 41 U/L   ALT 14 (L) 17 - 63 U/L   Alkaline Phosphatase 65 38 - 126 U/L   Total Bilirubin 0.4 0.3 - 1.2 mg/dL   GFR calc non Af Amer >60 >60 mL/min   GFR calc Af Amer >60 >60 mL/min   Anion gap 5 5 - 15  Lactic acid, plasma     Status: None   Collection Time: 08/28/15  5:02 PM  Result Value Ref Range   Lactic Acid, Venous 0.8 0.5 - 2.0 mmol/L  Sedimentation rate     Status: None   Collection Time: 08/28/15  5:02 PM  Result Value Ref Range   Sed Rate 14 0 - 16 mm/hr  TSH     Status: None   Collection Time: 08/28/15  5:02 PM  Result Value Ref Range   TSH 0.662 0.350 - 4.500 uIU/mL  Urinalysis, Routine w reflex microscopic (not at [facility])     Status: None   Collection Time: 08/28/15   5:45 PM  Result Value Ref Range   Color, Urine YELLOW YELLOW   APPearance CLEAR CLEAR   Specific Gravity, Urine 1.029 1.005 - 1.030   pH 6.0 5.0 - 8.0   Glucose, UA NEGATIVE NEGATIVE mg/dL   Hgb urine dipstick NEGATIVE NEGATIVE   Bilirubin Urine NEGATIVE NEGATIVE   Ketones, ur NEGATIVE NEGATIVE mg/dL   Protein, ur NEGATIVE NEGATIVE mg/dL   Nitrite NEGATIVE NEGATIVE   Leukocytes, UA NEGATIVE NEGATIVE  C-reactive protein     Status: Abnormal   Collection Time: 08/28/15  6:21 PM  Result Value Ref Range   CRP 1.5 (H) <1.0 mg/dL  Comprehensive metabolic panel     Status: Abnormal   Collection Time: 08/29/15  4:19 AM  Result Value Ref Range   Sodium 140 135 - 145 mmol/L   Potassium 3.7 3.5 - 5.1 mmol/L   Chloride 106 101 - 111 mmol/L   CO2 27 22 - 32 mmol/L   Glucose, Bld 92 65 - 99 mg/dL   BUN 8 6 - 20 mg/dL   Creatinine, Ser 4.69 (H) 0.61 - 1.24 mg/dL   Calcium 8.8 (L) 8.9 - 10.3 mg/dL   Total Protein 5.9 (L) 6.5 - 8.1 g/dL   Albumin 3.1 (L) 3.5 - 5.0 g/dL   AST 12 (L) 15 - 41 U/L   ALT 11 (L) 17 - 63 U/L   Alkaline Phosphatase 55 38 - 126 U/L   Total Bilirubin 0.6 0.3 - 1.2 mg/dL   GFR calc non Af Amer >60 >60 mL/min   GFR calc Af Amer >60 >60 mL/min   Anion gap 7 5 - 15  CBC     Status: None   Collection Time: 08/29/15  4:19 AM  Result Value Ref Range   WBC 7.0 4.0 - 10.5 K/uL   RBC 4.90 4.22 - 5.81 MIL/uL   Hemoglobin 13.9 13.0 - 17.0 g/dL   HCT 62.9 52.8 - 41.3 %   MCV 85.3 78.0 - 100.0 fL   MCH 28.4 26.0 - 34.0 pg   MCHC 33.3 30.0 - 36.0 g/dL   RDW 24.4 01.0 - 27.2 %   Platelets 213 150 - 400 K/uL    Micro Results: No results found for this or any previous visit (from the past 240 hour(s)).  Medications:  Scheduled Meds: . antiseptic oral rinse  7 mL Mouth Rinse BID  . piperacillin-tazobactam (ZOSYN)  IV  3.375 g Intravenous Q8H   Continuous Infusions: . sodium chloride 125 mL/hr at 08/29/15 1111   PRN Meds:.morphine injection, ondansetron **OR**  ondansetron (ZOFRAN) IV  Assessment/Plan:  1. Small Bowel Obstruction - Pt is being admitted to medical bed and made NPO. He will be started on IVF hydration. IV pain and nausea meds as needed. Consult to general surgery being made. Place NG tube to low wall suction. Bowel Rest. Further recommendations pending surgery consult. Zosyn IV ordered.  2. Enteritis - concerning for an infectious versus inflammatory etiology -continue antibiotics. Follow surg and GI recommendations.  3. Nausea and voming - resolved now.  IV nausea medications ordered as needed.   LOS: 1 day   Laniesha Das 08/29/2015, 12:27 PM  Rodney Langton, MD FAAFP [facility] [place], [place]  Digital Pager [phone]

## 2015-08-29 NOTE — Progress Notes (Signed)
Initial Nutrition Assessment  DOCUMENTATION CODES:   Obesity unspecified  INTERVENTION:  - Diet advancement as medically feasible - RD will continue to monitor for needs, especially with diet advancement.  NUTRITION DIAGNOSIS:   Inadequate oral intake related to inability to eat as evidenced by NPO status.  GOAL:   Patient will meet greater than or equal to 90% of their needs  MONITOR:   Diet advancement, Weight trends, Labs, I & O's  REASON FOR ASSESSMENT:   Malnutrition Screening Tool    ASSESSMENT:   35 y.o. male with no known significant PMH presents as direct admit from PCP office with diagnosis of abdominal pain and SBO. The patient reports having progressively worsening abdominal pain for past 2 weeks. He has been having intermittent sharp, crampy mid-epigastric and RLQ pain that forced him to leave work and go to the doctor. His symptoms started with bloating and mild pain that he thought was related to constipation. He was taking laxatives at home but symptoms not getting better. He had bowel movements up until the last couple of days and then was not able to have a bowel movement. He began to have nausea and vomiting in last 24 hours. He was seen by PCP today and got a CT scan of the abdomen revealing small bowel obstruction and suspicion of enteritis.  Pt seen for MST. BMI indicates obesity. Pt has been NPO since admission with CT showing SBO. Previous plan for NGT placement; placement now being held with no vomiting. Pt reports abdominal pain x2 weeks PTA but that it has been worse in the past  4-5 days. He states that on Tuesday (6/6) pain became much worse than it previously had been. On 6/7 he had his last meal for lunch: grilled chicken salad. On 6/8 he was only able to consume water and half of a granola bar and later in the day developed nausea and had an episode of emesis.   Pt reports that abdominal pain truly began after Memorial Day weekend and he first  thought it was related to eating too much and eating foods he typically does not. Pt reports he tries to eat healthy most of the time.  No muscle or fat wasting present. Per review, pt has lost 16 lbs (7% body weight) in the past 7 months which is not significant for time frame. Weight loss was intentional with a change in eating habits.   Not able to meet needs at this time. Medications reviewed. IVF: NS @ 125 mL/hr. Lab reviewed; creatinine elevated and trending up, Ca: 8.8 mg/dL.    Diet Order:  Diet NPO time specified Except for: Ice Chips, Sips with Meds  Skin:  Reviewed, no issues  Last BM:  6/9  Height:   Ht Readings from Last 1 Encounters:  08/28/15 5\' 10"  (1.778 m)    Weight:   Wt Readings from Last 1 Encounters:  08/28/15 218 lb (98.884 kg)    Ideal Body Weight:  75.45 kg (kg)  BMI:  Body mass index is 31.28 kg/(m^2).  Estimated Nutritional Needs:   Kcal:  1900-2100  Protein:  95-105 grams  Fluid:  >/= 2 L/day  EDUCATION NEEDS:   No education needs identified at this time     Trenton Gammon, RD, LDN Inpatient Clinical Dietitian Pager # [phone] After hours/weekend pager # [phone]

## 2015-08-29 NOTE — Progress Notes (Signed)
SBO (small bowel obstruction) ([facility])  Subjective: Pt feels about the same.  Still with RLQ pain, no fevers  Objective: Vital signs in last 24 hours: Temp:  [97.6 F (36.4 C)-98.3 F (36.8 C)] 97.6 F (36.4 C) (06/10 0539) Pulse Rate:  [52-84] 52 (06/10 0539) Resp:  [17-18] 17 (06/10 0539) BP: (110-140)/(54-88) 110/54 mmHg (06/10 0539) SpO2:  [98 %-99 %] 98 % (06/10 0539) Weight:  [98.884 kg (218 lb)] 98.884 kg (218 lb) (06/09 1622) Last BM Date: 08/28/15  Intake/Output from previous day: 06/09 0701 - 06/10 0700 In: 1429.2 [I.V.:1429.2] Out: 250 [Urine:250] Intake/Output this shift:   General appearance: alert and cooperative GI: abnormal findings:  mild TTP in RLQ, non-distended  Lab Results:  Results for orders placed or performed during the hospital encounter of 08/28/15 (from the past 24 hour(s))  CBC with Differential/Platelet     Status: None   Collection Time: 08/28/15  5:02 PM  Result Value Ref Range   WBC 6.8 4.0 - 10.5 K/uL   RBC 5.16 4.22 - 5.81 MIL/uL   Hemoglobin 14.8 13.0 - 17.0 g/dL   HCT 95.2 84.1 - 32.4 %   MCV 84.9 78.0 - 100.0 fL   MCH 28.7 26.0 - 34.0 pg   MCHC 33.8 30.0 - 36.0 g/dL   RDW 40.1 02.7 - 25.3 %   Platelets 219 150 - 400 K/uL   Neutrophils Relative % 62 %   Neutro Abs 4.2 1.7 - 7.7 K/uL   Lymphocytes Relative 26 %   Lymphs Abs 1.8 0.7 - 4.0 K/uL   Monocytes Relative 11 %   Monocytes Absolute 0.8 0.1 - 1.0 K/uL   Eosinophils Relative 1 %   Eosinophils Absolute 0.1 0.0 - 0.7 K/uL   Basophils Relative 0 %   Basophils Absolute 0.0 0.0 - 0.1 K/uL  Comprehensive metabolic panel     Status: Abnormal   Collection Time: 08/28/15  5:02 PM  Result Value Ref Range   Sodium 133 (L) 135 - 145 mmol/L   Potassium 3.3 (L) 3.5 - 5.1 mmol/L   Chloride 101 101 - 111 mmol/L   CO2 27 22 - 32 mmol/L   Glucose, Bld 119 (H) 65 - 99 mg/dL   BUN 9 6 - 20 mg/dL   Creatinine, Ser 6.64 (H) 0.61 - 1.24 mg/dL   Calcium 9.1 8.9 - 40.3 mg/dL   Total Protein  6.3 (L) 6.5 - 8.1 g/dL   Albumin 3.4 (L) 3.5 - 5.0 g/dL   AST 14 (L) 15 - 41 U/L   ALT 14 (L) 17 - 63 U/L   Alkaline Phosphatase 65 38 - 126 U/L   Total Bilirubin 0.4 0.3 - 1.2 mg/dL   GFR calc non Af Amer >60 >60 mL/min   GFR calc Af Amer >60 >60 mL/min   Anion gap 5 5 - 15  Lactic acid, plasma     Status: None   Collection Time: 08/28/15  5:02 PM  Result Value Ref Range   Lactic Acid, Venous 0.8 0.5 - 2.0 mmol/L  Sedimentation rate     Status: None   Collection Time: 08/28/15  5:02 PM  Result Value Ref Range   Sed Rate 14 0 - 16 mm/hr  TSH     Status: None   Collection Time: 08/28/15  5:02 PM  Result Value Ref Range   TSH 0.662 0.350 - 4.500 uIU/mL  Urinalysis, Routine w reflex microscopic (not at [facility])     Status: None   Collection  Time: 08/28/15  5:45 PM  Result Value Ref Range   Color, Urine YELLOW YELLOW   APPearance CLEAR CLEAR   Specific Gravity, Urine 1.029 1.005 - 1.030   pH 6.0 5.0 - 8.0   Glucose, UA NEGATIVE NEGATIVE mg/dL   Hgb urine dipstick NEGATIVE NEGATIVE   Bilirubin Urine NEGATIVE NEGATIVE   Ketones, ur NEGATIVE NEGATIVE mg/dL   Protein, ur NEGATIVE NEGATIVE mg/dL   Nitrite NEGATIVE NEGATIVE   Leukocytes, UA NEGATIVE NEGATIVE  C-reactive protein     Status: Abnormal   Collection Time: 08/28/15  6:21 PM  Result Value Ref Range   CRP 1.5 (H) <1.0 mg/dL  Comprehensive metabolic panel     Status: Abnormal   Collection Time: 08/29/15  4:19 AM  Result Value Ref Range   Sodium 140 135 - 145 mmol/L   Potassium 3.7 3.5 - 5.1 mmol/L   Chloride 106 101 - 111 mmol/L   CO2 27 22 - 32 mmol/L   Glucose, Bld 92 65 - 99 mg/dL   BUN 8 6 - 20 mg/dL   Creatinine, Ser 4.97 (H) 0.61 - 1.24 mg/dL   Calcium 8.8 (L) 8.9 - 10.3 mg/dL   Total Protein 5.9 (L) 6.5 - 8.1 g/dL   Albumin 3.1 (L) 3.5 - 5.0 g/dL   AST 12 (L) 15 - 41 U/L   ALT 11 (L) 17 - 63 U/L   Alkaline Phosphatase 55 38 - 126 U/L   Total Bilirubin 0.6 0.3 - 1.2 mg/dL   GFR calc non Af Amer >60 >60 mL/min    GFR calc Af Amer >60 >60 mL/min   Anion gap 7 5 - 15  CBC     Status: None   Collection Time: 08/29/15  4:19 AM  Result Value Ref Range   WBC 7.0 4.0 - 10.5 K/uL   RBC 4.90 4.22 - 5.81 MIL/uL   Hemoglobin 13.9 13.0 - 17.0 g/dL   HCT 53.0 05.1 - 10.2 %   MCV 85.3 78.0 - 100.0 fL   MCH 28.4 26.0 - 34.0 pg   MCHC 33.3 30.0 - 36.0 g/dL   RDW 11.1 73.5 - 67.0 %   Platelets 213 150 - 400 K/uL     Studies/Results Radiology     MEDS, Scheduled . antiseptic oral rinse  7 mL Mouth Rinse BID  . piperacillin-tazobactam (ZOSYN)  IV  3.375 g Intravenous Q8H     Assessment: SBO (small bowel obstruction) (HCC) Appears to be due to an area of small bowel inflammation.  CT shows a loop of distal jejunum that is thickened and inflamed and the source of his SBO.  Terminal ileum is decompressed.  Could be due to IBD, but I think other causes of inflammation have to be considered as well.    Plan: Pt currently not toxic, cont IV abx and NPO for now.  If symptoms don't improve over the next 36h on abx, would consider diagnostic laparotomy.   LOS: 1 day    Vanita Panda, MD [facility], [place] [phone]   08/29/2015 9:50 AM

## 2015-08-29 NOTE — Consult Note (Addendum)
CROSS COVER [facility] Reason for Consult: SBO-?Crohn's disease. Referring Physician: [facility]-Dr. Irwin Brakeman.  Luis Pineda is an 35 y.o. male.  HPI: 35 year old white male, was in his USOH till about 2 weeks ago when he started having abdominal pain that seemed to progressively worsen, prompting him to to see his PCP, Dr. 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 Patient was advised to come to the hospital for further evaluation. He was seen by Dr. Georganna Skeans last night on admission and empiric antibiotics were started. Patient gives a history of having loose stools 3-4 BMs per day since 2011. He had a CT scan done at that time which was essentially unrevealing. He died denies any ocular complaints skin rashes or joint pains. He had some nausea and vomiting prior to admission but that that has since resolved. There are no history of associated fever chills or rigors. He denies the use or abuse of  nonsteroidals. There is no family history of inflammatory bowel disease.   Past Medical History   Diagnosis Date  No significant PMH  Past Surgical History  Procedure Laterality Date  . Lasik  2 weeks ago  . Arthroscopy knee surgery Right    Family History  Problem Relation Age of Onset  . No known family history of GI disorders     Social History:  reports that he has never smoked. He has never used smokeless tobacco. He reports that he drinks alcohol. He reports that he does not use illicit drugs.  Allergies: No Known Allergies  Medications: I have reviewed the patient's current medications.  Results for orders placed or performed during the hospital encounter of 08/28/15 (from the past 48 hour(s))  CBC with Differential/Platelet     Status: None   Collection Time: 08/28/15  5:02 PM  Result Value Ref Range   WBC 6.8 4.0 - 10.5 K/uL   RBC 5.16 4.22 - 5.81  MIL/uL   Hemoglobin 14.8 13.0 - 17.0 g/dL   HCT 43.8 39.0 - 52.0 %   MCV 84.9 78.0 - 100.0 fL   MCH 28.7 26.0 - 34.0 pg   MCHC 33.8 30.0 - 36.0 g/dL   RDW 12.3 11.5 - 15.5 %   Platelets 219 150 - 400 K/uL   Neutrophils Relative % 62 %   Neutro Abs 4.2 1.7 - 7.7 K/uL   Lymphocytes Relative 26 %   Lymphs Abs 1.8 0.7 - 4.0 K/uL   Monocytes Relative 11 %   Monocytes Absolute 0.8 0.1 - 1.0 K/uL   Eosinophils Relative 1 %   Eosinophils Absolute 0.1 0.0 - 0.7 K/uL   Basophils Relative 0 %   Basophils Absolute 0.0 0.0 - 0.1 K/uL  Comprehensive metabolic panel     Status: Abnormal   Collection Time: 08/28/15  5:02 PM  Result Value Ref Range   Sodium 133 (L) 135 - 145 mmol/L   Potassium 3.3 (L) 3.5 - 5.1 mmol/L   Chloride 101 101 - 111 mmol/L   CO2 27 22 - 32 mmol/L   Glucose, Bld 119 (H) 65 - 99 mg/dL   BUN 9 6 - 20 mg/dL   Creatinine, Ser 1.28 (H) 0.61 - 1.24 mg/dL   Calcium 9.1 8.9 - 10.3 mg/dL   Total Protein 6.3 (L) 6.5 - 8.1 g/dL   Albumin 3.4 (L) 3.5 - 5.0 g/dL   AST 14 (  L) 15 - 41 U/L   ALT 14 (L) 17 - 63 U/L   Alkaline Phosphatase 65 38 - 126 U/L   Total Bilirubin 0.4 0.3 - 1.2 mg/dL   GFR calc non Af Amer >60 >60 mL/min   GFR calc Af Amer >60 >60 mL/min    Comment: (NOTE) The eGFR has been calculated using the CKD EPI equation. This calculation has not been validated in all clinical situations. eGFR's persistently <60 mL/min signify possible Chronic Kidney Disease.    Anion gap 5 5 - 15  Lactic acid, plasma     Status: None   Collection Time: 08/28/15  5:02 PM  Result Value Ref Range   Lactic Acid, Venous 0.8 0.5 - 2.0 mmol/L  Sedimentation rate     Status: None   Collection Time: 08/28/15  5:02 PM  Result Value Ref Range   Sed Rate 14 0 - 16 mm/hr  TSH     Status: None   Collection Time: 08/28/15  5:02 PM  Result Value Ref Range   TSH 0.662 0.350 - 4.500 uIU/mL  Urinalysis, Routine w reflex microscopic (not at [facility])     Status: None   Collection Time:  08/28/15  5:45 PM  Result Value Ref Range   Color, Urine YELLOW YELLOW   APPearance CLEAR CLEAR   Specific Gravity, Urine 1.029 1.005 - 1.030   pH 6.0 5.0 - 8.0   Glucose, UA NEGATIVE NEGATIVE mg/dL   Hgb urine dipstick NEGATIVE NEGATIVE   Bilirubin Urine NEGATIVE NEGATIVE   Ketones, ur NEGATIVE NEGATIVE mg/dL   Protein, ur NEGATIVE NEGATIVE mg/dL   Nitrite NEGATIVE NEGATIVE   Leukocytes, UA NEGATIVE NEGATIVE    Comment: MICROSCOPIC NOT DONE ON URINES WITH NEGATIVE PROTEIN, BLOOD, LEUKOCYTES, NITRITE, OR GLUCOSE <1000 mg/dL.  C-reactive protein     Status: Abnormal   Collection Time: 08/28/15  6:21 PM  Result Value Ref Range   CRP 1.5 (H) <1.0 mg/dL  Comprehensive metabolic panel     Status: Abnormal   Collection Time: 08/29/15  4:19 AM  Result Value Ref Range   Sodium 140 135 - 145 mmol/L    Comment: DELTA CHECK NOTED   Potassium 3.7 3.5 - 5.1 mmol/L   Chloride 106 101 - 111 mmol/L   CO2 27 22 - 32 mmol/L   Glucose, Bld 92 65 - 99 mg/dL   BUN 8 6 - 20 mg/dL   Creatinine, Ser 1.32 (H) 0.61 - 1.24 mg/dL   Calcium 8.8 (L) 8.9 - 10.3 mg/dL   Total Protein 5.9 (L) 6.5 - 8.1 g/dL   Albumin 3.1 (L) 3.5 - 5.0 g/dL   AST 12 (L) 15 - 41 U/L   ALT 11 (L) 17 - 63 U/L   Alkaline Phosphatase 55 38 - 126 U/L   Total Bilirubin 0.6 0.3 - 1.2 mg/dL   GFR calc non Af Amer >60 >60 mL/min   GFR calc Af Amer >60 >60 mL/min    Comment: (NOTE) The eGFR has been calculated using the CKD EPI equation. This calculation has not been validated in all clinical situations. eGFR's persistently <60 mL/min signify possible Chronic Kidney Disease.    Anion gap 7 5 - 15  CBC     Status: None   Collection Time: 08/29/15  4:19 AM  Result Value Ref Range   WBC 7.0 4.0 - 10.5 K/uL   RBC 4.90 4.22 - 5.81 MIL/uL   Hemoglobin 13.9 13.0 - 17.0 g/dL   HCT 41.8  39.0 - 52.0 %   MCV 85.3 78.0 - 100.0 fL   MCH 28.4 26.0 - 34.0 pg   MCHC 33.3 30.0 - 36.0 g/dL   RDW 12.4 11.5 - 15.5 %   Platelets 213 150 - 400  K/uL   Ct Abdomen Pelvis W Contrast  08/28/2015  CLINICAL DATA:  Epigastric pain EXAM: CT ABDOMEN AND PELVIS WITH CONTRAST TECHNIQUE: Multidetector CT imaging of the abdomen and pelvis was performed using the standard protocol following bolus administration of intravenous contrast. CONTRAST:  145m ISOVUE-300 IOPAMIDOL (ISOVUE-300) INJECTION 61% COMPARISON:  12/18/2009 FINDINGS: Lower chest: The lung bases are clear. No pleural or pericardial effusion noted. Hepatobiliary: No suspicious liver abnormalities identified. The gallbladder appears normal. There is no biliary dilatation. Pancreas: No mass, inflammatory changes, or other significant abnormality. Spleen: Within normal limits in size and appearance. Adrenals/Urinary Tract: The adrenal glands are normal. Unremarkable appearance of both kidneys. The urinary bladder is within normal limits. Stomach/Bowel: The stomach appears normal. The proximal small bowel loops are unremarkable. There is increase caliber of the mid small bowel loops which measure up to 3.5 cm. Transition point is within the central abdomen where there is an abnormal loop of small bowel which exhibits abnormal mucosal enhanced and wall thickening with surrounding inflammation/ fat stranding. There is associated luminal narrowing. The affected right lower quadrant bowel loops appeared tethered, image 74 series 2. 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 Small lymph nodes are identified within the surrounding mesenteric fat. Bowel wall thickness measures up to 8 mm, image 34 of series 3. Decreased caliber of the terminal ileum. Unremarkable appearance of the colon. Vascular/Lymphatic: Normal appearance of the abdominal aorta. No enlarged retroperitoneal or mesenteric adenopathy. No enlarged pelvic or inguinal lymph nodes. Reproductive: No mass or other significant abnormality.  Other: There is a moderate amount of free fluid identified within the pelvis. At this time there are no fluid collections which would be amendable to drainage. Musculoskeletal: There is a well defined sclerotic focus within the right iliac bone. This has a nonaggressive appearance and likely reflects benign abnormality. IMPRESSION: 1. Small bowel obstruction. The transition point is within the right lower quadrant of the abdomen, likely mid to distal ileum. 2. Abnormal appearance of right lower quadrant small bowel loops which exhibit wall thickening, mucosal enhanced, luminal narrowing, and surrounding inflammation. Findings are worrisome for scratch set findings are compatible with enteritis. This may be inflammatory or infectious in etiology. Crohn's disease is not excluded. 3. Extraluminal collection of gas and phlegmon with surrounding inflammation is identified within the right lower quadrant and is adjacent to the abnormal appearing small bowel loops. Although this may reflect bowel perforation, in the setting of inflammatory bowel disease this would be indicative of penetrating disease. These results were called by telephone at the time of interpretation on 08/28/2015 at 2:17 pm to Dr. RGaynelle Arabian, who verbally acknowledged these results. Electronically Signed   By: TKerby MoorsM.D.   On: 08/28/2015 14:19   Review of Systems  Constitutional: Negative for fever, chills, weight loss, malaise/fatigue and diaphoresis.  HENT: Negative.   Respiratory: Negative.   Cardiovascular: Negative.   Gastrointestinal: Positive for nausea, abdominal pain and diarrhea. Negative for heartburn, constipation, blood in stool and melena.  Skin: Negative.   Neurological: Negative.  Negative for weakness.  Endo/Heme/Allergies: Negative.   Psychiatric/Behavioral: Negative.    Blood pressure 110/54, pulse 52,  temperature 97.6 F (36.4 C), temperature source Oral, resp. rate 17, height '5\' 10"'  (1.778 m), weight 98.884  kg (218 lb), SpO2 98 %. Physical Exam  Constitutional: He is oriented to person, place, and time. He appears well-developed and well-nourished.  HENT:  Head: Normocephalic and atraumatic.  Eyes: Conjunctivae and EOM are normal. Pupils are equal, round, and reactive to light.  Neck: Normal range of motion. Neck supple.  Cardiovascular: Normal rate and regular rhythm.   Respiratory: Effort normal and breath sounds normal.  GI: Soft. Bowel sounds are normal. He exhibits no mass. There is tenderness. There is guarding. There is no rebound.  Musculoskeletal: Normal range of motion.  Neurological: He is alert and oriented to person, place, and time.  Skin: Skin is warm and dry.  Psychiatric: He has a normal mood and affect. His behavior is normal. Thought content normal.   Assessment/Plan: 1) SBO-mid-distal ileum with ?penetrating Crohn's disease-wall thickening with mucosal enhancement and luminal narrowing-he has no fevers or leukocytosis. Will start Solumedrol 60 mg IV and monitor closely. Appreciate Dr. Marcello Moores and Dr. Biagio Borg input. AVOID ALL NSAIDS. The need for tissue diagnosis discussed with the patient in great detail.   2) Elevated creatinine-improving with hydration.  Elaiza Shoberg 08/29/2015, 8:58 AM

## 2015-08-30 MED ORDER — MORPHINE SULFATE (PF) 2 MG/ML IV SOLN
1.0000 mg | INTRAVENOUS | Status: DC | PRN
  Administered 2015-08-30 – 2015-09-01 (×4): 1 mg via INTRAVENOUS
  Filled 2015-08-30 (×4): qty 1

## 2015-08-30 NOTE — Progress Notes (Signed)
Pt. States no pain since eating/drinking clear liquid diet. Pt. States he would like to stay on a clear liquid diet until dinner.

## 2015-08-30 NOTE — Progress Notes (Signed)
Cross cover for [facility] Subjective: Since I last evaluated the patient, he seems to be doing much better. His first dose of Solumedrol was given yesterday at 3 pm and another early this morning around 4 AM. He has had 3 loose BM's today with the passage of a lot of flatus. The abdominal pain has improved significantly.   Objective: Vital signs in last 24 hours: Temp:  [97.5 F (36.4 C)-97.8 F (36.6 C)] 97.5 F (36.4 C) (06/11 0407) Pulse Rate:  [50-76] 50 (06/11 0407) Resp:  [17-18] 17 (06/11 0407) BP: (100-119)/(47-60) 100/47 mmHg (06/11 0407) SpO2:  [95 %-98 %] 97 % (06/11 0407) Last BM Date: 08/28/15  Intake/Output from previous day: 06/10 0701 - 06/11 0700 In: 3405 [P.O.:180; I.V.:3125; IV Piggyback:100] Out: 1050 [Urine:1050] Intake/Output this shift:   General appearance: alert, cooperative, appears stated age, fatigued, no distress and mildly obese Resp: clear to auscultation bilaterally Cardio: regular rate and rhythm, S1, S2 normal, no murmur, click, rub or gallop GI: soft, non-tender; bowel sounds normal; no masses,  no organomegaly Extremities: extremities normal, atraumatic, no cyanosis or edema  Lab Results:  Recent Labs  08/28/15 1702 08/29/15 0419  WBC 6.8 7.0  HGB 14.8 13.9  HCT 43.8 41.8  PLT 219 213   BMET  Recent Labs  08/28/15 1702 08/29/15 0419  NA 133* 140  K 3.3* 3.7  CL 101 106  CO2 27 27  GLUCOSE 119* 92  BUN 9 8  CREATININE 1.28* 1.32*  CALCIUM 9.1 8.8*   LFT  Recent Labs  08/29/15 0419  PROT 5.9*  ALBUMIN 3.1*  AST 12*  ALT 11*  ALKPHOS 55  BILITOT 0.6   Studies/Results: Ct Abdomen Pelvis W Contrast  08/28/2015  CLINICAL DATA:  Epigastric pain EXAM: CT ABDOMEN AND PELVIS WITH CONTRAST TECHNIQUE: Multidetector CT imaging of the abdomen and pelvis was performed using the standard protocol following bolus administration of intravenous contrast. CONTRAST:  ISOVUE-300 IOPAMIDOL (ISOVUE-300) INJECTION 61% COMPARISON:   12/18/2009 FINDINGS: Lower chest: The lung bases are clear. No pleural or pericardial effusion noted. Hepatobiliary: No suspicious liver abnormalities identified. The gallbladder appears normal. There is no biliary dilatation. Pancreas: No mass, inflammatory changes, or other significant abnormality. Spleen: Within normal limits in size and appearance. Adrenals/Urinary Tract: The adrenal glands are normal. Unremarkable appearance of both kidneys. The urinary bladder is within normal limits. Stomach/Bowel: The stomach appears normal. The proximal small bowel loops are unremarkable. There is increase caliber of the mid small bowel loops which measure up to 3.5 cm. Transition point is within the central abdomen where there is an abnormal loop of small bowel which exhibits abnormal mucosal enhanced and wall thickening with surrounding inflammation/ fat stranding. There is associated luminal narrowing. The affected right lower quadrant bowel loops appeared tethered, image 74 series 2. 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 Small lymph nodes are identified within the surrounding mesenteric fat. Bowel wall thickness measures up to 8 mm, image 34 of series 3. Decreased caliber of the terminal ileum. Unremarkable appearance of the colon. Vascular/Lymphatic: Normal appearance of the abdominal aorta. No enlarged retroperitoneal or mesenteric adenopathy. No enlarged pelvic or inguinal lymph nodes. Reproductive: No mass or other significant abnormality. Other: There is a moderate amount of free fluid identified within the pelvis. At this time there are no fluid collections which would be amendable to drainage. Musculoskeletal: There is a well defined sclerotic  focus within the right iliac bone. This has a nonaggressive appearance and likely reflects benign abnormality. IMPRESSION: 1. Small bowel obstruction. The  transition point is within the right lower quadrant of the abdomen, likely mid to distal ileum. 2. Abnormal appearance of right lower quadrant small bowel loops which exhibit wall thickening, mucosal enhanced, luminal narrowing, and surrounding inflammation. Findings are worrisome for scratch set findings are compatible with enteritis. This may be inflammatory or infectious in etiology. Crohn's disease is not excluded. 3. Extraluminal collection of gas and phlegmon with surrounding inflammation is identified within the right lower quadrant and is adjacent to the abnormal appearing small bowel loops. Although this may reflect bowel perforation, in the setting of inflammatory bowel disease this would be indicative of penetrating disease. These results were called by telephone at the time of interpretation on 08/28/2015 at 2:17 pm to Dr. Blair Heys , who verbally acknowledged these results. Electronically Signed   By: Signa Kell M.D.   On: 08/28/2015 14:19   Medications: I have reviewed the patient's current medications.  Assessment/Plan: ?Crohn's enteritis with distal SBO-patient seems to be doing much better on Solumderol. I do not think this is an infectious enteritis. On Zosyn. Will start with chips and sips and advance of full liquids as tolerated. .Encourage ambulation and minimize the use of narcotics. Patient had several questions that i have answered. Dr. Adela Lank will see him tomorrow.  LOS: 2 days   [place]ia Pfund 08/30/2015, 9:13 AM

## 2015-08-30 NOTE — Progress Notes (Addendum)
[facility] Progress Note  08/30/2015  Subjective: Pt reports feeling better, had BM and hearing bowel sounds  Objective:  Vital signs in last 24 hours: Filed Vitals:   08/29/15 0539 08/29/15 1324 08/29/15 2057 08/30/15 0407  BP: 110/54 119/60 108/53 100/47  Pulse: 52 53 76 50  Temp: 97.6 F (36.4 C) 97.8 F (36.6 C) 97.6 F (36.4 C) 97.5 F (36.4 C)  TempSrc: Oral Oral Oral Oral  Resp: Height:      Weight:      SpO2: 98% 98% 95% 97%   Weight change:   Intake/Output Summary (Last 24 hours) at 08/30/15 0906 Last data filed at 08/30/15 0856  Gross per 24 hour  Intake   3345 ml  Output    850 ml  Net   2495 ml   No results found for: HGBA1C Lab Results  Component Value Date   CREATININE 1.32* 08/29/2015    Review of Systems As above, otherwise all reviewed and reported negative  Physical Exam  General exam: well developed and nourished patient, lying comfortably supine on the bed in no obvious distress but does appear ill.  Head, eyes and ENT: Nontraumatic and normocephalic. Pupils equally reacting to light and accommodation. Oral mucosa dry.  Neck: Supple. No JVD, carotid bruit or thyromegaly.  Lymphatics: No lymphadenopathy.  Respiratory system: Clear to auscultation. No increased work of breathing.  Cardiovascular system: S1 and S2 heard, RRR. No JVD, murmurs, gallops, clicks or pedal edema.  Gastrointestinal system: Abdomen is nondistended but less tender to palpation in RLQ and epigastric areas, some tenderness noted in LLQ and normal bowel sounds heard. No organomegaly or masses appreciated.  Central nervous system: Alert and oriented. No focal neurological deficits.  Extremities: Symmetric 5 x 5 power. Peripheral pulses symmetrically felt.   Skin: No rashes or acute findings.  Musculoskeletal system: Negative exam.  Psychiatry: Pleasant and cooperative.  Lab Results: No results found for this or any previous visit (from the  past 24 hour(s)).  Micro Results: Recent Results (from the past 240 hour(s))  Culture, blood (Routine X 2) w Reflex to ID Panel     Status: None (Preliminary result)   Collection Time: 08/28/15  6:24 PM  Result Value Ref Range Status   Specimen Description BLOOD LEFT ANTECUBITAL  Final   Special Requests BOTTLES DRAWN AEROBIC AND ANAEROBIC 10CC  Final   Culture NO GROWTH < 24 HOURS  Final   Report Status PENDING  Incomplete  Culture, blood (Routine X 2) w Reflex to ID Panel     Status: None (Preliminary result)   Collection Time: 08/28/15  6:33 PM  Result Value Ref Range Status   Specimen Description BLOOD RIGHT ANTECUBITAL  Final   Special Requests BOTTLES DRAWN AEROBIC AND ANAEROBIC 5CC  Final   Culture NO GROWTH < 24 HOURS  Final   Report Status PENDING  Incomplete    Medications:  Scheduled Meds: . antiseptic oral rinse  7 mL Mouth Rinse BID  . methylPREDNISolone (SOLU-MEDROL) injection  60 mg Intravenous Q12H  . piperacillin-tazobactam (ZOSYN)  IV  3.375 g Intravenous Q8H   Continuous Infusions: . sodium chloride 125 mL/hr at 08/30/15 0422   PRN Meds:.morphine injection, ondansetron **OR** ondansetron (ZOFRAN) IV  Assessment/Plan:  1. Small Bowel Obstruction - Pt clinically improving. Advance diet per gen surgery.  IV pain and nausea meds as needed. Further recommendations pending surgery consult.  2. Enteritis - concerning for an infectious versus inflammatory etiology -continue  antibiotics. Started on solumedrol per GI.  Follow surg and GI recommendations. on Zosyn IV.  3. Nausea and voming - resolved now.  IV nausea medications ordered as needed. 4. Elevated creatinine - IVF hydration, avoid nephrotoxic drugs.  Follow BMP.   LOS: 2 days   Clanford Johnson 08/30/2015, 9:06 AM  Rodney Langton, MD FAAFP [facility] [place], [place]  Digital Pager [phone]

## 2015-08-30 NOTE — Progress Notes (Signed)
Pt. Having liquid bowel movements. Some small, dark brown pieces were observed in patient's bowel movement this evening. It did not look bloody or bright red. Will continue to monitor.

## 2015-08-30 NOTE — Progress Notes (Addendum)
SBO (small bowel obstruction) (HCC)  Subjective: Pain better today, but still requiring IV narcotics.  Had several BM's.  Started solumedrol this am.    Objective: Vital signs in last 24 hours: Temp:  [97.5 F (36.4 C)-97.8 F (36.6 C)] 97.5 F (36.4 C) (06/11 0407) Pulse Rate:  [50-76] 50 (06/11 0407) Resp:  [17-18] 17 (06/11 0407) BP: (100-119)/(47-60) 100/47 mmHg (06/11 0407) SpO2:  [95 %-98 %] 97 % (06/11 0407) Last BM Date: 08/28/15  Intake/Output from previous day: 06/10 0701 - 06/11 0700 In: 3405 [P.O.:180; I.V.:3125; IV Piggyback:100] Out: 1050 [Urine:1050] Intake/Output this shift:   General appearance: alert and cooperative GI: abnormal findings:  mild TTP in RLQ, non-distended  Lab Results:  No results found for this or any previous visit (from the past 24 hour(s)).   Studies/Results Radiology     MEDS, Scheduled . antiseptic oral rinse  7 mL Mouth Rinse BID  . methylPREDNISolone (SOLU-MEDROL) injection  60 mg Intravenous Q12H  . piperacillin-tazobactam (ZOSYN)  IV  3.375 g Intravenous Q8H     Assessment: SBO (small bowel obstruction) (HCC) Appears to be due to an area of small bowel inflammation.  CT shows a loop of distal jejunum that is thickened and inflamed and the source of his SBO.  Terminal ileum is decompressed.  Could be due to IBD, but I think other causes of inflammation have to be considered as well.    Plan: Pt currently not toxic, cont IV abx.  Will start some clears today and advance to full liquids if he tolerates this.  If symptoms worsen over the next 24h on abx and steroids, would consider diagnostic laparoscopy.   LOS: 2 days    Vanita Panda, MD [facility], [place] [phone]   08/30/2015 9:08 AM

## 2015-08-31 DIAGNOSIS — K50912 Crohn's disease, unspecified, with intestinal obstruction: Secondary | ICD-10-CM

## 2015-08-31 DIAGNOSIS — K529 Noninfective gastroenteritis and colitis, unspecified: Secondary | ICD-10-CM

## 2015-08-31 DIAGNOSIS — K5669 Other intestinal obstruction: Secondary | ICD-10-CM

## 2015-08-31 LAB — HEMOGLOBIN AND HEMATOCRIT, BLOOD
HCT: 39.6 % (ref 39.0–52.0)
Hemoglobin: 13.1 g/dL (ref 13.0–17.0)

## 2015-08-31 LAB — COMPREHENSIVE METABOLIC PANEL
ALBUMIN: 2.9 g/dL — AB (ref 3.5–5.0)
ALK PHOS: 49 U/L (ref 38–126)
ALT: 10 U/L — ABNORMAL LOW (ref 17–63)
ANION GAP: 4 — AB (ref 5–15)
AST: 10 U/L — ABNORMAL LOW (ref 15–41)
BUN: 8 mg/dL (ref 6–20)
CALCIUM: 8.7 mg/dL — AB (ref 8.9–10.3)
CHLORIDE: 108 mmol/L (ref 101–111)
CO2: 27 mmol/L (ref 22–32)
Creatinine, Ser: 1.13 mg/dL (ref 0.61–1.24)
GFR calc Af Amer: >60 mL/min (ref >60)
GFR calc non Af Amer: >60 mL/min (ref >60)
GLUCOSE: 122 mg/dL — AB (ref 65–99)
Potassium: 4.2 mmol/L (ref 3.5–5.1)
SODIUM: 139 mmol/L (ref 135–145)
Total Bilirubin: 0.4 mg/dL (ref 0.3–1.2)
Total Protein: 5.4 g/dL — ABNORMAL LOW (ref 6.5–8.1)

## 2015-08-31 MED ORDER — METHYLPREDNISOLONE SODIUM SUCC 125 MG IJ SOLR: 60.0000 mg | INTRAMUSCULAR | Status: DC

## 2015-08-31 MED ORDER — ENOXAPARIN SODIUM 40 MG/0.4ML ~~LOC~~ SOLN
40.0000 mg | SUBCUTANEOUS | Status: DC
  Filled 2015-08-31 (×2): qty 0.4

## 2015-08-31 NOTE — Progress Notes (Signed)
      Progress Note   Subjective  Patient reports he continues to feel improve. Less abdominal pain, tolerating full liquids, wants to eat, no vomiting. Passing stools.    Objective   Vital signs in last 24 hours: Temp:  [97.8 F (36.6 C)-98.1 F (36.7 C)] 97.8 F (36.6 C) (06/12 1404) Pulse Rate:  [46-58] 50 (06/12 1404) Resp:  [17-18] 17 (06/12 0646) BP: (113-135)/(47-66) 135/66 mmHg (06/12 1404) SpO2:  [96 %-99 %] 99 % (06/12 1404) Last BM Date: 08/30/15 General:    white male in NAD Heart:  Regular rate and rhythm; no murmurs Lungs: Respirations even and unlabored, lungs CTA bilaterally Abdomen:  Soft, mild RLQ TTP and nondistended. Normal bowel sounds. Extremities:  Without edema. Neurologic:  Alert and oriented,  grossly normal neurologically. Psych:  Cooperative. Normal mood and affect.  Intake/Output from previous day: 06/11 0701 - 06/12 0700 In: 480 [P.O.:480] Out: 900 [Urine:900] Intake/Output this shift:    Lab Results:  Recent Labs  08/28/15 1702 08/29/15 0419 08/31/15 0359  WBC 6.8 7.0  --   HGB 14.8 13.9 13.1  HCT 43.8 41.8 39.6  PLT 219 213  --    BMET  Recent Labs  08/28/15 1702 08/29/15 0419 08/31/15 0359  NA 133* 140 139  K 3.3* 3.7 4.2  CL 101 106 108  CO2 27 27 27   GLUCOSE 119* 92 122*  BUN 9 8 8   CREATININE 1.28* 1.32* 1.13  CALCIUM 9.1 8.8* 8.7*   LFT  Recent Labs  08/31/15 0359  PROT 5.4*  ALBUMIN 2.9*  AST 10*  ALT 10*  ALKPHOS 49  BILITOT 0.4   PT/INR No results for input(s): LABPROT, INR in the last 72 hours.  Studies/Results: No results found.     Assessment / Plan:   35 y/o male previously healthy presenting with a few weeks of abdominal pain, found to have bowel obstruction of the distal small bowel, ? penetrating disease suspicious for Crohns but no abscess noted. He has responded well to antibiotics and IV solumedrol thus far. Now tolerating full liquid diet.   I spent several minutes with him  discussing IBD in general, suspicion for Crohns, but need to further evaluate and assess for other etiologies. At this time recommend the following: -advance diet to soft as tolerated -will transition to 40mg  prednisone tomorrow, and if he does well with oral regimen and plan for discharge in the next 1-2 days -he will need EGD and colonoscopy in the upcoming few weeks to more objectively evaluate for IBD. If he does have Crohns causing this which is the suspicion at this time, would likely treat with anti-TNF initially given this presentation -will send quantiferon gold to ensure negative  -continue antibiotics for now -okay for DVT prophylaxis with lovenox  We will reassess him tomorrow.   Ileene Patrick, MD [facility] Pager [phone]      LOS: 3 days   Luis Pineda  08/31/2015, 3:56 PM

## 2015-08-31 NOTE — Progress Notes (Addendum)
[facility] Progress Note  08/31/2015  Subjective: Pt reports feeling better, had BM and hearing bowel sounds  Objective:  Vital signs in last 24 hours: Filed Vitals:   08/30/15 0407 08/30/15 1310 08/30/15 2142 08/31/15 0646  BP: 100/47 101/59 113/47 113/64  Pulse: 50 67 58 46  Temp: 97.5 F (36.4 C) 97.6 F (36.4 C) 98.1 F (36.7 C) 97.8 F (36.6 C)  TempSrc: Oral Oral Oral Oral  Resp: Height:      Weight:      SpO2: 97% 97% 96% 96%   Weight change:   Intake/Output Summary (Last 24 hours) at 08/31/15 0912 Last data filed at 08/30/15 1100  Gross per 24 hour  Intake    480 ml  Output    200 ml  Net    280 ml   No results found for: HGBA1C Lab Results  Component Value Date   CREATININE 1.13 08/31/2015    Review of Systems As above, otherwise all reviewed and reported negative  Physical Exam  General exam: well developed and nourished patient, lying comfortably supine on the bed in no obvious distress but does appear ill.  Head, eyes and ENT: Nontraumatic and normocephalic. Pupils equally reacting to light and accommodation. Oral mucosa dry.  Neck: Supple. No JVD, carotid bruit or thyromegaly.  Lymphatics: No lymphadenopathy.  Respiratory system: Clear to auscultation. No increased work of breathing.  Cardiovascular system: S1 and S2 heard, RRR. No JVD, murmurs, gallops, clicks or pedal edema.  Gastrointestinal system: Abdomen is nondistended but less tender to palpation in RLQ and epigastric areas, some tenderness noted in LLQ and normal bowel sounds heard. No organomegaly or masses appreciated.  Central nervous system: Alert and oriented. No focal neurological deficits.  Extremities: Symmetric 5 x 5 power. Peripheral pulses symmetrically felt.   Skin: No rashes or acute findings.  Musculoskeletal system: Negative exam.  Psychiatry: Pleasant and cooperative.  Lab Results: Results for orders placed or performed during the hospital  encounter of 08/28/15 (from the past 24 hour(s))  Comprehensive metabolic panel     Status: Abnormal   Collection Time: 08/31/15  3:59 AM  Result Value Ref Range   Sodium 139 135 - 145 mmol/L   Potassium 4.2 3.5 - 5.1 mmol/L   Chloride 108 101 - 111 mmol/L   CO2 27 22 - 32 mmol/L   Glucose, Bld 122 (H) 65 - 99 mg/dL   BUN 8 6 - 20 mg/dL   Creatinine, Ser 4.54 0.61 - 1.24 mg/dL   Calcium 8.7 (L) 8.9 - 10.3 mg/dL   Total Protein 5.4 (L) 6.5 - 8.1 g/dL   Albumin 2.9 (L) 3.5 - 5.0 g/dL   AST 10 (L) 15 - 41 U/L   ALT 10 (L) 17 - 63 U/L   Alkaline Phosphatase 49 38 - 126 U/L   Total Bilirubin 0.4 0.3 - 1.2 mg/dL   GFR calc non Af Amer >60 >60 mL/min   GFR calc Af Amer >60 >60 mL/min   Anion gap 4 (L) 5 - 15  Hemoglobin and hematocrit, blood     Status: None   Collection Time: 08/31/15  3:59 AM  Result Value Ref Range   Hemoglobin 13.1 13.0 - 17.0 g/dL   HCT 09.8 11.9 - 14.7 %    Micro Results: Recent Results (from the past 240 hour(s))  Culture, blood (Routine X 2) w Reflex to ID Panel     Status: None (Preliminary result)   Collection  Time: 08/28/15  6:24 PM  Result Value Ref Range Status   Specimen Description BLOOD LEFT ANTECUBITAL  Final   Special Requests BOTTLES DRAWN AEROBIC AND ANAEROBIC 10CC  Final   Culture NO GROWTH 2 DAYS  Final   Report Status PENDING  Incomplete  Culture, blood (Routine X 2) w Reflex to ID Panel     Status: None (Preliminary result)   Collection Time: 08/28/15  6:33 PM  Result Value Ref Range Status   Specimen Description BLOOD RIGHT ANTECUBITAL  Final   Special Requests BOTTLES DRAWN AEROBIC AND ANAEROBIC 5CC  Final   Culture NO GROWTH 2 DAYS  Final   Report Status PENDING  Incomplete    Medications:  Scheduled Meds: . antiseptic oral rinse  7 mL Mouth Rinse BID  . methylPREDNISolone (SOLU-MEDROL) injection  60 mg Intravenous Q12H  . piperacillin-tazobactam (ZOSYN)  IV  3.375 g Intravenous Q8H   Continuous Infusions:   PRN Meds:.morphine  injection, ondansetron **OR** ondansetron (ZOFRAN) IV  Assessment/Plan:  1. Small Bowel Obstruction - Pt clinically improving. Advance diet per gen surgery and GI.  Tolerating full liquid diet.  Small BM and gas noted. IV pain and nausea meds as needed. Further recommendations pending GI and surgery.  2. 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  Follow surg and GI recommendations. on Zosyn IV.  3. Nausea and voming - resolved now.  IV nausea medications ordered as needed. 4. Elevated creatinine, resolved 6/12 - avoid nephrotoxic drugs.  Follow BMP.   LOS: 3 days   Clanford Johnson 08/31/2015, 9:12 AM  Rodney Langton, MD FAAFP [facility] [place], [place]  Digital Pager [phone]

## 2015-08-31 NOTE — Progress Notes (Signed)
Patient ID: Luis Pineda, male   DOB: May 20, 1980, 35 y.o.   MRN: [id number]     [facility]      [address] [place], [place] [address]    Phone: [phone] FAX: [phone]     Subjective: No n/v. No pain meds since 3AM. Overall better.  Having diarrhea.   Objective:  Vital signs:  Filed Vitals:   08/30/15 0407 08/30/15 1310 08/30/15 2142 08/31/15 0646  BP: 100/47 101/59 113/47 113/64  Pulse: 50 67 58 46  Temp: 97.5 F (36.4 C) 97.6 F (36.4 C) 98.1 F (36.7 C) 97.8 F (36.6 C)  TempSrc: Oral Oral Oral Oral  Resp: '17 17 18 17  ' Height:      Weight:      SpO2: 97% 97% 96% 96%    Last BM Date: 08/30/15  Intake/Output   Yesterday:  06/11 0701 - 06/12 0700 In: 480 [P.O.:480] Out: 900 [Urine:900] This shift: I/O last 3 completed shifts: In: 2105 [P.O.:480; I.V.:1625] Out: 900 [Urine:900]    Physical Exam: General: Pt awake/alert/oriented x4 in no acute distress  Abdomen: Soft.  Nondistended.  ttp rlq and mild in llq.  No evidence of peritonitis.  No incarcerated hernias.   Problem List:   Principal Problem:   SBO (small bowel obstruction) (HCC) Active Problems:   Generalized abdominal pain   RLQ abdominal pain   Nausea and vomiting   Enteritis    Results:   Labs: Results for orders placed or performed during the hospital encounter of 08/28/15 (from the past 48 hour(s))  Comprehensive metabolic panel     Status: Abnormal   Collection Time: 08/31/15  3:59 AM  Result Value Ref Range   Sodium 139 135 - 145 mmol/L   Potassium 4.2 3.5 - 5.1 mmol/L   Chloride 108 101 - 111 mmol/L   CO2 27 22 - 32 mmol/L   Glucose, Bld 122 (H) 65 - 99 mg/dL   BUN 8 6 - 20 mg/dL   Creatinine, Ser 1.13 0.61 - 1.24 mg/dL   Calcium 8.7 (L) 8.9 - 10.3 mg/dL   Total Protein 5.4 (L) 6.5 - 8.1 g/dL   Albumin 2.9 (L) 3.5 - 5.0 g/dL   AST 10 (L) 15 - 41 U/L   ALT 10 (L) 17 - 63 U/L   Alkaline Phosphatase 49 38 - 126 U/L   Total Bilirubin 0.4 0.3 - 1.2 mg/dL   GFR calc non Af Amer >60 >60 mL/min   GFR calc Af Amer >60 >60 mL/min    Comment: (NOTE) The eGFR has been calculated using the CKD EPI equation. This calculation has not been validated in all clinical situations. eGFR's persistently <60 mL/min signify possible Chronic Kidney Disease.    Anion gap 4 (L) 5 - 15  Hemoglobin and hematocrit, blood     Status: None   Collection Time: 08/31/15  3:59 AM  Result Value Ref Range   Hemoglobin 13.1 13.0 - 17.0 g/dL   HCT 39.6 39.0 - 52.0 %    Imaging / Studies: No results found.  Medications / Allergies:  Scheduled Meds: . antiseptic oral rinse  7 mL Mouth Rinse BID  . methylPREDNISolone (SOLU-MEDROL) injection  60 mg Intravenous Q12H  . piperacillin-tazobactam (ZOSYN)  IV  3.375 g Intravenous Q8H   Continuous Infusions:  PRN Meds:.morphine injection, ondansetron **OR** ondansetron (ZOFRAN) IV  Antibiotics: Anti-infectives    Start     Dose/Rate Route Frequency Ordered Stop  08/28/15 1830  piperacillin-tazobactam (ZOSYN) IVPB 3.375 g     3.375 g 12.5 mL/hr over 240 Minutes Intravenous Every 8 hours 08/28/15 1730     08/28/15 1730  ciprofloxacin (CIPRO) IVPB 400 mg  Status:  Discontinued     400 mg 200 mL/hr over 60 Minutes Intravenous Every 12 hours 08/28/15 1724 08/28/15 1730   08/28/15 1730  metroNIDAZOLE (FLAGYL) IVPB 500 mg  Status:  Discontinued     500 mg 100 mL/hr over 60 Minutes Intravenous Every 8 hours 08/28/15 1724 08/28/15 1730        Assessment/Plan SBO-small bowel inflammation, questionable IBD.  GI on board.  Non surgical abdomen.  Having bowel function and tolerating fulls.  Can advance diet per GI recs.  On steroids and zosyn.    Erby Pian, [facility] Pager [phone]) For consults and floor pages call [phone](7A-4:30P)  08/31/2015 8:51 AM

## 2015-09-01 DIAGNOSIS — K5090 Crohn's disease, unspecified, without complications: Secondary | ICD-10-CM | POA: Diagnosis present

## 2015-09-01 DIAGNOSIS — K50019 Crohn's disease of small intestine with unspecified complications: Secondary | ICD-10-CM

## 2015-09-01 MED ORDER — METRONIDAZOLE 500 MG PO TABS: 500.0000 mg | ORAL_TABLET | Freq: Two times a day (BID) | ORAL | Status: DC

## 2015-09-01 MED ORDER — CIPROFLOXACIN HCL 500 MG PO TABS: 500.0000 mg | ORAL_TABLET | Freq: Two times a day (BID) | ORAL | Status: DC

## 2015-09-01 MED ORDER — PREDNISONE 20 MG PO TABS
40.0000 mg | ORAL_TABLET | Freq: Every day | ORAL | Status: DC
  Administered 2015-09-01: 40 mg via ORAL
  Filled 2015-09-01: qty 2

## 2015-09-01 MED ORDER — PREDNISONE 20 MG PO TABS: 40.0000 mg | ORAL_TABLET | Freq: Every day | ORAL | Status: DC

## 2015-09-01 NOTE — Progress Notes (Signed)
Patient ID: Luis Pineda, male   DOB: [birth year], 35 y.o.   MRN: [id number]     [facility]      [address] [place], [place] [address]    Phone: [phone] FAX: [phone]     Subjective: No n/v.  Having BM. Tolerating POs.   Objective:  Vital signs:  Filed Vitals:   08/31/15 0646 08/31/15 1404 08/31/15 2126 09/01/15 0523  BP: 113/64 135/66 120/50 107/52  Pulse: 46 50 57 46  Temp: 97.8 F (36.6 C) 97.8 F (36.6 C) 97.5 F (36.4 C) 98.6 F (37 C)  TempSrc: Oral Axillary Oral Oral  Resp: '17  18 18  ' Height:      Weight:      SpO2: 96% 99% 97% 97%    Last BM Date: 08/31/15  Intake/Output   Yesterday:    This shift:  Total I/O In: 240 [P.O.:240] Out: -    Physical Exam: General: Pt awake/alert/oriented x4 in no acute distress  Abdomen: Soft. Nondistended. non tender.  No evidence of peritonitis. No incarcerated hernias.   Problem List:   Principal Problem:   SBO (small bowel obstruction) (HCC) Active Problems:   Generalized abdominal pain   RLQ abdominal pain   Nausea and vomiting   Enteritis    Results:   Labs: Results for orders placed or performed during the hospital encounter of 08/28/15 (from the past 48 hour(s))  Comprehensive metabolic panel     Status: Abnormal   Collection Time: 08/31/15  3:59 AM  Result Value Ref Range   Sodium 139 135 - 145 mmol/L   Potassium 4.2 3.5 - 5.1 mmol/L   Chloride 108 101 - 111 mmol/L   CO2 27 22 - 32 mmol/L   Glucose, Bld 122 (H) 65 - 99 mg/dL   BUN 8 6 - 20 mg/dL   Creatinine, Ser 1.13 0.61 - 1.24 mg/dL   Calcium 8.7 (L) 8.9 - 10.3 mg/dL   Total Protein 5.4 (L) 6.5 - 8.1 g/dL   Albumin 2.9 (L) 3.5 - 5.0 g/dL   AST 10 (L) 15 - 41 U/L   ALT 10 (L) 17 - 63 U/L   Alkaline Phosphatase 49 38 - 126 U/L   Total Bilirubin 0.4 0.3 - 1.2 mg/dL   GFR calc non Af Amer >60 >60 mL/min   GFR calc Af Amer >60 >60 mL/min    Comment: (NOTE) The eGFR has been  calculated using the CKD EPI equation. This calculation has not been validated in all clinical situations. eGFR's persistently <60 mL/min signify possible Chronic Kidney Disease.    Anion gap 4 (L) 5 - 15  Hemoglobin and hematocrit, blood     Status: None   Collection Time: 08/31/15  3:59 AM  Result Value Ref Range   Hemoglobin 13.1 13.0 - 17.0 g/dL   HCT 39.6 39.0 - 52.0 %    Imaging / Studies: No results found.  Medications / Allergies:  Scheduled Meds: . enoxaparin (LOVENOX) injection  40 mg Subcutaneous Q24H  . methylPREDNISolone (SOLU-MEDROL) injection  60 mg Intravenous Q24H  . piperacillin-tazobactam (ZOSYN)  IV  3.375 g Intravenous Q8H   Continuous Infusions:  PRN Meds:.morphine injection, ondansetron **OR** ondansetron (ZOFRAN) IV  Antibiotics: Anti-infectives    Start     Dose/Rate Route Frequency Ordered Stop   08/28/15 1830  piperacillin-tazobactam (ZOSYN) IVPB 3.375 g     3.375 g 12.5 mL/hr over 240 Minutes Intravenous Every 8 hours 08/28/15  1730     08/28/15 1730  ciprofloxacin (CIPRO) IVPB 400 mg  Status:  Discontinued     400 mg 200 mL/hr over 60 Minutes Intravenous Every 12 hours 08/28/15 1724 08/28/15 1730   08/28/15 1730  metroNIDAZOLE (FLAGYL) IVPB 500 mg  Status:  Discontinued     500 mg 100 mL/hr over 60 Minutes Intravenous Every 8 hours 08/28/15 1724 08/28/15 1730         Assessment/Plan SBO-small bowel inflammation, questionable IBD.  Non surgical abdomen.  Tolerating solids and having BMs.  Management per GI.  Signing off.  Please call for further assistance.   Erby Pian, [facility] Pager [phone]) For consults and floor pages call [phone](7A-4:30P)  09/01/2015 9:04 AM

## 2015-09-01 NOTE — Progress Notes (Signed)
Physician Discharge Summary  Luis Pineda  ZOX:[id number]  DOB: 1980-12-14  DOA: 08/28/2015  PCP: Thora Lance, MD  Admit date: 08/28/2015 Discharge date: 09/01/2015  Time spent: 33 minutes  Recommendations for Outpatient Follow-up:  1. Follow up with GI on June 27 at 10 AM as scheduled  Discharge Diagnoses:  Principal Problem:   SBO (small bowel obstruction) (HCC) Active Problems:   Generalized abdominal pain   RLQ abdominal pain   Enteritis   Crohn's disease (HCC)   Nausea and vomiting  Discharge Condition: Improved & Stable  Diet recommendation: advance at tolerated to regular  Filed Weights   08/28/15 1622  Weight: 218 lb (98.884 kg)    History of present illness:  Luis Pineda is a 35 y.o. male with no known significant PMH presents as direct admit from PCP office with diagnosis of abdominal pain and SBO. The patient reports having progressively worsening abdominal pain for past 2 weeks. He has been having intermittent sharp, crampy mid-epigastric and RLQ pain that forced him to leave work and go to the doctor. His symptoms started with bloating and mild pain that he thought was related to constipation. He was taking laxatives at home but symptoms not getting better. He had bowel movements up until the last couple of days and then was not able to have a bowel movement. He began to have nausea and vomiting in last 24 hours. He was seen by PCP today and got a CT scan of the abdomen revealing small bowel obstruction and suspicion of enteritis. He was admitted for further evaluation and management.   Hospital Course:   1. Small Bowel Obstruction - Pt clinically improved with rest, slowly advanced diet and tolerated well. Surgery followed in hospital and then signed off.  2. IBS/Enteritis - likely crohn's disease,  Started on solumedrol per GI and then transitioned to oral prednisone 40 mg daily.  He will be discharged on prednisone 40 mg until he follows up with GI.  Also discharged on cipro and flagyl x 5 more days.  3. Nausea and voming - resolved now.He tolerated diet and had BMs before discharge.  4. Elevated creatinine, resolved 6/12 - avoid nephrotoxic drugs.  Consultations:  General surgery  GI   Discharge Exam: Pt says he is feeling better.  He is without complaints.  He feels ready to go home.   Filed Vitals:   08/31/15 2126 09/01/15 0523 09/01/15 0930 09/01/15 1432  BP: 120/50 107/52 126/56 119/55  Pulse: 57 46 45 58  Temp: 97.5 F (36.4 C) 98.6 F (37 C)  98.1 F (36.7 C)  TempSrc: Oral Oral  Oral  Resp: 18 18    Height:      Weight:      SpO2: 97% 97% 96% 95%     General exam: well developed and nourished patient, lying comfortably supine on the bed in no obvious distress but does appear ill.  Head, eyes and ENT: Nontraumatic and normocephalic. Pupils equally reacting to light and accommodation. Oral mucosa dry.  Neck: Supple. No JVD, carotid bruit or thyromegaly.  Lymphatics: No lymphadenopathy.  Respiratory system: Clear to auscultation. No increased work of breathing.  Cardiovascular system: S1 and S2 heard, RRR. No JVD, murmurs, gallops, clicks or pedal edema.  Gastrointestinal system: Abdomen is nondistended but less tender to palpation in RLQ and epigastric areas, some tenderness noted in LLQ and normal bowel sounds heard. No organomegaly or masses appreciated.  Central nervous system: Alert and oriented. No focal neurological  deficits.  Extremities: Symmetric 5 x 5 power. Peripheral pulses symmetrically felt.   Skin: No rashes or acute findings.  Musculoskeletal system: Negative exam.  Psychiatry: Pleasant and cooperative.  Discharge Instructions  Discharge Instructions    Discharge instructions    Complete by:  As directed   Please follow up with GI as scheduled on June 27 at 10 am     Increase activity slowly    Complete by:  As directed             Medication List    TAKE these  medications        ciprofloxacin 500 MG tablet  Commonly known as:  CIPRO  Take 1 tablet (500 mg total) by mouth 2 (two) times daily.     metroNIDAZOLE 500 MG tablet  Commonly known as:  FLAGYL  Take 1 tablet (500 mg total) by mouth 2 (two) times daily with a meal. DO NOT CONSUME ALCOHOL WHILE TAKING THIS MEDICATION.     predniSONE 20 MG tablet  Commonly known as:  DELTASONE  Take 2 tablets (40 mg total) by mouth daily with breakfast.           Follow-up Information    Follow up with Mike Gip, PA-C On 09/15/2015.   Specialty:  Gastroenterology   Why:  at 10 AM   Contact information:   [address] [place] [address] [phone]       Follow up with Thora Lance, MD In 2 weeks.   Specialty:  Family Medicine   Why:  hospital followup    Contact information:   [address] [place] [address] [phone]      Get Medicines reviewed and adjusted: Please take all your medications with you for your next visit with your Primary MD  Please request your Primary MD to go over all hospital tests and procedure/radiological results at the follow up. Please ask your Primary MD to get all Hospital records sent to his/her office.  If you experience worsening of your admission symptoms, develop shortness of breath, life threatening emergency, suicidal or homicidal thoughts you must seek medical attention immediately by calling 911 or calling your MD immediately if symptoms less severe.  You must read complete instructions/literature along with all the possible adverse reactions/side effects for all the Medicines you take and that have been prescribed to you. Take any new Medicines after you have completely understood and accept all the possible adverse reactions/side effects.   Do not drive when taking pain medications.   Do not take more than prescribed Pain, Sleep and Anxiety Medications  Special Instructions: If you have smoked or chewed Tobacco  in the last 2 yrs please stop smoking, stop any regular Alcohol and or any Recreational drug use.  Wear Seat belts while driving.  Please note  You were cared for by a hospitalist during your hospital stay. Once you are discharged, your primary care physician will handle any further medical issues. Please note that NO REFILLS for any discharge medications will be authorized once you are discharged, as it is imperative that you return to your primary care physician (or establish a relationship with a primary care physician if you do not have one) for your aftercare needs so that they can reassess your need for medications and monitor your lab values.  The results of significant diagnostics from this hospitalization (including imaging, microbiology, ancillary and laboratory) are listed below for reference.    Significant Diagnostic Studies:  Ct Abdomen Pelvis W Contrast  08/28/2015  CLINICAL DATA:  Epigastric pain EXAM: CT ABDOMEN AND PELVIS WITH CONTRAST TECHNIQUE: Multidetector CT imaging of the abdomen and pelvis was performed using the standard protocol following bolus administration of intravenous contrast. CONTRAST:  ISOVUE-300 IOPAMIDOL (ISOVUE-300) INJECTION 61% COMPARISON:  12/18/2009 FINDINGS: Lower chest: The lung bases are clear. No pleural or pericardial effusion noted. Hepatobiliary: No suspicious liver abnormalities identified. The gallbladder appears normal. There is no biliary dilatation. Pancreas: No mass, inflammatory changes, or other significant abnormality. Spleen: Within normal limits in size and appearance. Adrenals/Urinary Tract: The adrenal glands are normal. Unremarkable appearance of both kidneys. The urinary bladder is within normal limits. Stomach/Bowel: The stomach appears normal. The proximal small bowel loops are unremarkable. There is increase caliber of the mid small bowel loops which measure up to 3.5 cm. Transition point is within the central abdomen where there is  an abnormal loop of small bowel which exhibits abnormal mucosal enhanced and wall thickening with surrounding inflammation/ fat stranding. There is associated luminal narrowing. The affected right lower quadrant bowel loops appeared tethered, image 74 series 2. 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 Small lymph nodes are identified within the surrounding mesenteric fat. Bowel wall thickness measures up to 8 mm, image 34 of series 3. Decreased caliber of the terminal ileum. Unremarkable appearance of the colon. Vascular/Lymphatic: Normal appearance of the abdominal aorta. No enlarged retroperitoneal or mesenteric adenopathy. No enlarged pelvic or inguinal lymph nodes. Reproductive: No mass or other significant abnormality. Other: There is a moderate amount of free fluid identified within the pelvis. At this time there are no fluid collections which would be amendable to drainage. Musculoskeletal: There is a well defined sclerotic focus within the right iliac bone. This has a nonaggressive appearance and likely reflects benign abnormality. IMPRESSION: 1. Small bowel obstruction. The transition point is within the right lower quadrant of the abdomen, likely mid to distal ileum. 2. Abnormal appearance of right lower quadrant small bowel loops which exhibit wall thickening, mucosal enhanced, luminal narrowing, and surrounding inflammation. Findings are worrisome for scratch set findings are compatible with enteritis. This may be inflammatory or infectious in etiology. Crohn's disease is not excluded. 3. Extraluminal collection of gas and phlegmon with surrounding inflammation is identified within the right lower quadrant and is adjacent to the abnormal appearing small bowel loops. Although this may reflect bowel perforation, in the setting of inflammatory bowel disease this would be indicative of  penetrating disease. These results were called by telephone at the time of interpretation on 08/28/2015 at 2:17 pm to Dr. Blair Heys , who verbally acknowledged these results. Electronically Signed   By: Signa Kell M.D.   On: 08/28/2015 14:19    Microbiology: Recent Results (from the past 240 hour(s))  Culture, blood (Routine X 2) w Reflex to ID Panel     Status: None (Preliminary result)   Collection Time: 08/28/15  6:24 PM  Result Value Ref Range Status   Specimen Description BLOOD LEFT ANTECUBITAL  Final   Special Requests BOTTLES DRAWN AEROBIC AND ANAEROBIC 10CC  Final   Culture NO GROWTH 3 DAYS  Final   Report Status PENDING  Incomplete  Culture, blood (Routine X 2) w Reflex to ID Panel     Status: None (Preliminary result)   Collection Time: 08/28/15  6:33 PM  Result Value Ref Range Status   Specimen Description  BLOOD RIGHT ANTECUBITAL  Final   Special Requests BOTTLES DRAWN AEROBIC AND ANAEROBIC 5CC  Final   Culture NO GROWTH 3 DAYS  Final   Report Status PENDING  Incomplete    Labs: Basic Metabolic Panel:  Recent Labs Lab 08/28/15 1702 08/29/15 0419 08/31/15 0359  NA 133* 140 139  K 3.3* 3.7 4.2  CL 101 106 108  CO2 27 27 27   GLUCOSE 119* 92 122*  BUN 9 8 8   CREATININE 1.28* 1.32* 1.13  CALCIUM 9.1 8.8* 8.7*   Liver Function Tests:  Recent Labs Lab 08/28/15 1702 08/29/15 0419 08/31/15 0359  AST 14* 12* 10*  ALT 14* 11* 10*  ALKPHOS 65 55 49  BILITOT 0.4 0.6 0.4  PROT 6.3* 5.9* 5.4*  ALBUMIN 3.4* 3.1* 2.9*   No results for input(s): LIPASE, AMYLASE in the last 168 hours. No results for input(s): AMMONIA in the last 168 hours. CBC:  Recent Labs Lab 08/28/15 1702 08/29/15 0419 08/31/15 0359  WBC 6.8 7.0  --   NEUTROABS 4.2  --   --   HGB 14.8 13.9 13.1  HCT 43.8 41.8 39.6  MCV 84.9 85.3  --   PLT 219 213  --    Cardiac Enzymes: No results for input(s): CKTOTAL, CKMB, CKMBINDEX, TROPONINI in the last 168 hours. BNP: BNP (last 3  results) No results for input(s): BNP in the last 8760 hours.  ProBNP (last 3 results) No results for input(s): PROBNP in the last 8760 hours.  CBG: No results for input(s): GLUCAP in the last 168 hours.  Signed:  Standley Dakins, MD [facility] Pager [phone]  If 7PM-7AM, please contact night-coverage [email or web address] Password [facility] 09/01/2015, 4:04 PM

## 2015-09-01 NOTE — Progress Notes (Signed)
[facility] Progress Note  09/01/2015  Subjective: Pt reports that he is eating better and had bm  Objective:  Vital signs in last 24 hours: Filed Vitals:   08/31/15 1404 08/31/15 2126 09/01/15 0523 09/01/15 0930  BP: 135/66 120/50 107/52 126/56  Pulse: 50 57 46 45  Temp: 97.8 F (36.6 C) 97.5 F (36.4 C) 98.6 F (37 C)   TempSrc: Axillary Oral Oral   Resp:  18 18   Height:      Weight:      SpO2: 99% 97% 97% 96%   Weight change:   Intake/Output Summary (Last 24 hours) at 09/01/15 1126 Last data filed at 09/01/15 0846  Gross per 24 hour  Intake    240 ml  Output      0 ml  Net    240 ml   No results found for: HGBA1C Lab Results  Component Value Date   CREATININE 1.13 08/31/2015    Review of Systems As above, otherwise all reviewed and reported negative  Physical Exam  General exam: well developed and nourished patient, lying comfortably supine on the bed in no obvious distress but does appear ill.  Head, eyes and ENT: Nontraumatic and normocephalic. Pupils equally reacting to light and accommodation. Oral mucosa dry.  Neck: Supple. No JVD, carotid bruit or thyromegaly.  Lymphatics: No lymphadenopathy.  Respiratory system: Clear to auscultation. No increased work of breathing.  Cardiovascular system: S1 and S2 heard, RRR. No JVD, murmurs, gallops, clicks or pedal edema.  Gastrointestinal system: Abdomen is nondistended but less tender to palpation in RLQ and epigastric areas, some tenderness noted in LLQ and normal bowel sounds heard. No organomegaly or masses appreciated.  Central nervous system: Alert and oriented. No focal neurological deficits.  Extremities: Symmetric 5 x 5 power. Peripheral pulses symmetrically felt.   Skin: No rashes or acute findings.  Musculoskeletal system: Negative exam.  Psychiatry: Pleasant and cooperative.  Lab Results: No results found for this or any previous visit (from the past 24 hour(s)).  Micro  Results: Recent Results (from the past 240 hour(s))  Culture, blood (Routine X 2) w Reflex to ID Panel     Status: None (Preliminary result)   Collection Time: 08/28/15  6:24 PM  Result Value Ref Range Status   Specimen Description BLOOD LEFT ANTECUBITAL  Final   Special Requests BOTTLES DRAWN AEROBIC AND ANAEROBIC 10CC  Final   Culture NO GROWTH 3 DAYS  Final   Report Status PENDING  Incomplete  Culture, blood (Routine X 2) w Reflex to ID Panel     Status: None (Preliminary result)   Collection Time: 08/28/15  6:33 PM  Result Value Ref Range Status   Specimen Description BLOOD RIGHT ANTECUBITAL  Final   Special Requests BOTTLES DRAWN AEROBIC AND ANAEROBIC 5CC  Final   Culture NO GROWTH 3 DAYS  Final   Report Status PENDING  Incomplete    Medications:  Scheduled Meds: . enoxaparin (LOVENOX) injection  40 mg Subcutaneous Q24H  . piperacillin-tazobactam (ZOSYN)  IV  3.375 g Intravenous Q8H  . predniSONE  40 mg Oral Q breakfast   Continuous Infusions:   PRN Meds:.morphine injection, ondansetron **OR** ondansetron (ZOFRAN) IV  Assessment/Plan:  1. Small Bowel Obstruction - Pt clinically improving. Further recommendations pending GI regarding discharge.  Surgery signed off.   2. 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    3. Nausea and voming - resolved now.  IV nausea medications ordered as  needed. 4. Elevated creatinine, resolved 6/12 - avoid nephrotoxic drugs.    LOS: 4 days   Clanford Johnson 09/01/2015, 11:26 AM  Rodney Langton, MD FAAFP [facility] [place], [place]  Digital Pager [phone]

## 2015-09-01 NOTE — Progress Notes (Signed)
Nutrition Follow-up  DOCUMENTATION CODES:   Obesity unspecified  INTERVENTION:   -Encourage PO intake  NUTRITION DIAGNOSIS:   Inadequate oral intake related to inability to eat as evidenced by NPO status.  Resolved  GOAL:   Patient will meet greater than or equal to 90% of their needs  Met  MONITOR:   Diet advancement, Weight trends, Labs, I & O's  REASON FOR ASSESSMENT:   Malnutrition Screening Tool    ASSESSMENT:   35 y.o. male with no known significant PMH presents as direct admit from PCP office with diagnosis of abdominal pain and SBO. The patient reports having progressively worsening abdominal pain for past 2 weeks. He has been having intermittent sharp, crampy mid-epigastric and RLQ pain that forced him to leave work and go to the doctor. His symptoms started with bloating and mild pain that he thought was related to constipation. He was taking laxatives at home but symptoms not getting better. He had bowel movements up until the last couple of days and then was not able to have a bowel movement. He began to have nausea and vomiting in last 24 hours. He was seen by PCP today and got a CT scan of the abdomen revealing small bowel obstruction and suspicion of enteritis.  Pt sleeping soundly at time of visit. RD did not wake.   Per surgical notes, pt with small bowel inflammation, questionable IBD. GI initiated pt on solumedrol.   Pt has been advanced to a soft diet. Good appetite and tolerance of diet. PO: 100% of meals.   Case discussed with RN. Plan likely to d/c home today.   Labs reviewed.   Diet Order:  DIET SOFT Room service appropriate?: Yes; Fluid consistency:: Thin  Skin:  Reviewed, no issues  Last BM:  08/31/15  Height:   Ht Readings from Last 1 Encounters:  08/28/15 '5\' 10"'  (1.778 m)    Weight:   Wt Readings from Last 1 Encounters:  08/28/15 218 lb (98.884 kg)    Ideal Body Weight:  75.45 kg (kg)  BMI:  Body mass index is 31.28  kg/(m^2).  Estimated Nutritional Needs:   Kcal:  1900-2100  Protein:  95-105 grams  Fluid:  >/= 2 L/day  EDUCATION NEEDS:   No education needs identified at this time  Rondia Higginbotham A. Jimmye Norman, RD, LDN, CDE Pager: [phone] After hours Pager: [phone]

## 2015-09-01 NOTE — Progress Notes (Signed)
Discussed discharge summary with patient. Reviewed all medications with patient. Patient received work note. Patient ready for discharge. 

## 2015-09-01 NOTE — Progress Notes (Signed)
    Progress Note   Subjective   feels much better- tolerating solid diet without difficulty , only mild RLQ discomfort Feels he can manage at home   Objective   Vital signs in last 24 hours: Temp:  [97.5 F (36.4 C)-98.6 F (37 C)] 98.1 F (36.7 C) (06/13 1432) Pulse Rate:  [45-58] 58 (06/13 1432) Resp:  [18] 18 (06/13 0523) BP: (107-126)/(50-56) 119/55 mmHg (06/13 1432) SpO2:  [95 %-97 %] 95 % (06/13 1432) Last BM Date: 08/31/15 General:    White male in NAD Heart:  Regular rate and rhythm; no murmurs Lungs: Respirations even and unlabored, lungs CTA bilaterally Abdomen:  Soft, mildly tenderRLQ, and nondistended. Normal bowel sounds. Extremities:  Without edema. Neurologic:  Alert and oriented,  grossly normal neurologically. Psych:  Cooperative. Normal mood and affect.  Intake/Output from previous day:   Intake/Output this shift: Total I/O In: 240 [P.O.:240] Out: -   Lab Results:  Recent Labs  08/31/15 0359  HGB 13.1  HCT 39.6   BMET  Recent Labs  08/31/15 0359  NA 139  K 4.2  CL 108  CO2 27  GLUCOSE 122*  BUN 8  CREATININE 1.13  CALCIUM 8.7*   LFT  Recent Labs  08/31/15 0359  PROT 5.4*  ALBUMIN 2.9*  AST 10*  ALT 10*  ALKPHOS 49  BILITOT 0.4   PT/INR No results for input(s): LABPROT, INR in the last 72 hours.  Studies/Results: No results found.     Assessment / Plan:     #1 35 yo male with partial SBO secondary to inflammatory process distal ileum concerning for Crohns  He is much improved  On steroids and Zosyn   He is ok for discharge today from our perspective  Soft ,low roughage diet  Continue Prednisone 40 mg po qam until we see him in office in 2 weeks, then will taper Continue abx- Cipro 500 mg po BID x 5 more days, and flagyl 500 mg BID x 5  Office appt scheduled for  Follow up  With Mike Gip [employer] - June 27 th at 10 am/ [facility] , and will set up for Colon EGD at that time Ordered Quantiferon gold and hepatitis  serologies  In event biologics needed   Principal Problem:   SBO (small bowel obstruction) (HCC) Active Problems:   Generalized abdominal pain   RLQ abdominal pain   Nausea and vomiting   Enteritis     LOS: 4 days   Sya Nestler  09/01/2015, 3:16 PM

## 2015-09-01 NOTE — Discharge Instructions (Signed)
Please follow up with GI doctors as they have scheduled for you during your discussions with them.  Return if symptoms worsen or new problems develop.

## 2015-09-02 ENCOUNTER — Telehealth: Payer: Self-pay

## 2015-09-02 LAB — CULTURE, BLOOD (ROUTINE X 2)
CULTURE: NO GROWTH
Culture: NO GROWTH

## 2015-09-02 LAB — HEPATITIS PANEL, ACUTE
HCV Ab: <0.1 s/co ratio (ref 0.0–0.9)
Hep A IgM: NEGATIVE
Hep B C IgM: NEGATIVE
Hepatitis B Surface Ag: NEGATIVE

## 2015-09-02 NOTE — Telephone Encounter (Signed)
Message       Shafer, [place], can you help schedule this patient for an EGD and colonoscopy to be done in about 3-4 weeks? He is seeing Amy in 2 weeks but want to get him a spot on my schedule. Thanks         I scheduled patient for 10/13/15 8:30.  I left a message for the patient to call back and confirm appt

## 2015-09-04 LAB — QUANTIFERON IN TUBE
QFT TB AG MINUS NIL VALUE: 0.01 IU/mL
QUANTIFERON MITOGEN VALUE: 0.62 IU/mL
QUANTIFERON NIL VALUE: 0.01 [IU]/mL
QUANTIFERON TB AG VALUE: 0.02 [IU]/mL
QUANTIFERON TB GOLD: NEGATIVE

## 2015-09-04 LAB — QUANTIFERON TB GOLD ASSAY (BLOOD)

## 2015-09-04 NOTE — Discharge Summary (Signed)
Physician Discharge Summary  Luis Pineda  ZOX:[id number]  DOB: 1980-12-14  DOA: 08/28/2015  PCP: Thora Lance, MD  Admit date: 08/28/2015 Discharge date: 09/01/2015  Time spent: 33 minutes  Recommendations for Outpatient Follow-up:  1. Follow up with GI on June 27 at 10 AM as scheduled  Discharge Diagnoses:  Principal Problem:   SBO (small bowel obstruction) (HCC) Active Problems:   Generalized abdominal pain   RLQ abdominal pain   Enteritis   Crohn's disease (HCC)   Nausea and vomiting  Discharge Condition: Improved & Stable  Diet recommendation: advance at tolerated to regular  Filed Weights   08/28/15 1622  Weight: 218 lb (98.884 kg)    History of present illness:  Luis Pineda is a 35 y.o. male with no known significant PMH presents as direct admit from PCP office with diagnosis of abdominal pain and SBO. The patient reports having progressively worsening abdominal pain for past 2 weeks. He has been having intermittent sharp, crampy mid-epigastric and RLQ pain that forced him to leave work and go to the doctor. His symptoms started with bloating and mild pain that he thought was related to constipation. He was taking laxatives at home but symptoms not getting better. He had bowel movements up until the last couple of days and then was not able to have a bowel movement. He began to have nausea and vomiting in last 24 hours. He was seen by PCP today and got a CT scan of the abdomen revealing small bowel obstruction and suspicion of enteritis. He was admitted for further evaluation and management.   Hospital Course:   1. Small Bowel Obstruction - Pt clinically improved with rest, slowly advanced diet and tolerated well. Surgery followed in hospital and then signed off.  2. IBS/Enteritis - likely crohn's disease,  Started on solumedrol per GI and then transitioned to oral prednisone 40 mg daily.  He will be discharged on prednisone 40 mg until he follows up with GI.  Also discharged on cipro and flagyl x 5 more days.  3. Nausea and voming - resolved now.He tolerated diet and had BMs before discharge.  4. Elevated creatinine, resolved 6/12 - avoid nephrotoxic drugs.  Consultations:  General surgery  GI   Discharge Exam: Pt says he is feeling better.  He is without complaints.  He feels ready to go home.   Filed Vitals:   08/31/15 2126 09/01/15 0523 09/01/15 0930 09/01/15 1432  BP: 120/50 107/52 126/56 119/55  Pulse: 57 46 45 58  Temp: 97.5 F (36.4 C) 98.6 F (37 C)  98.1 F (36.7 C)  TempSrc: Oral Oral  Oral  Resp: 18 18    Height:      Weight:      SpO2: 97% 97% 96% 95%     General exam: well developed and nourished patient, lying comfortably supine on the bed in no obvious distress but does appear ill.  Head, eyes and ENT: Nontraumatic and normocephalic. Pupils equally reacting to light and accommodation. Oral mucosa dry.  Neck: Supple. No JVD, carotid bruit or thyromegaly.  Lymphatics: No lymphadenopathy.  Respiratory system: Clear to auscultation. No increased work of breathing.  Cardiovascular system: S1 and S2 heard, RRR. No JVD, murmurs, gallops, clicks or pedal edema.  Gastrointestinal system: Abdomen is nondistended but less tender to palpation in RLQ and epigastric areas, some tenderness noted in LLQ and normal bowel sounds heard. No organomegaly or masses appreciated.  Central nervous system: Alert and oriented. No focal neurological  deficits.  Extremities: Symmetric 5 x 5 power. Peripheral pulses symmetrically felt.   Skin: No rashes or acute findings.  Musculoskeletal system: Negative exam.  Psychiatry: Pleasant and cooperative.  Discharge Instructions  Discharge Instructions    Discharge instructions    Complete by:  As directed   Please follow up with GI as scheduled on June 27 at 10 am     Increase activity slowly    Complete by:  As directed             Medication List    TAKE these  medications        ciprofloxacin 500 MG tablet  Commonly known as:  CIPRO  Take 1 tablet (500 mg total) by mouth 2 (two) times daily.     metroNIDAZOLE 500 MG tablet  Commonly known as:  FLAGYL  Take 1 tablet (500 mg total) by mouth 2 (two) times daily with a meal. DO NOT CONSUME ALCOHOL WHILE TAKING THIS MEDICATION.     predniSONE 20 MG tablet  Commonly known as:  DELTASONE  Take 2 tablets (40 mg total) by mouth daily with breakfast.           Follow-up Information    Follow up with Mike Gip, PA-C On 09/15/2015.   Specialty:  Gastroenterology   Why:  at 10 AM   Contact information:   [address] [place] [address] [phone]       Follow up with Thora Lance, MD In 2 weeks.   Specialty:  Family Medicine   Why:  hospital followup    Contact information:   [address] [place] [address] [phone]      Get Medicines reviewed and adjusted: Please take all your medications with you for your next visit with your Primary MD  Please request your Primary MD to go over all hospital tests and procedure/radiological results at the follow up. Please ask your Primary MD to get all Hospital records sent to his/her office.  If you experience worsening of your admission symptoms, develop shortness of breath, life threatening emergency, suicidal or homicidal thoughts you must seek medical attention immediately by calling 911 or calling your MD immediately if symptoms less severe.  You must read complete instructions/literature along with all the possible adverse reactions/side effects for all the Medicines you take and that have been prescribed to you. Take any new Medicines after you have completely understood and accept all the possible adverse reactions/side effects.   Do not drive when taking pain medications.   Do not take more than prescribed Pain, Sleep and Anxiety Medications  Special Instructions: If you have smoked or chewed Tobacco  in the last 2 yrs please stop smoking, stop any regular Alcohol and or any Recreational drug use.  Wear Seat belts while driving.  Please note  You were cared for by a hospitalist during your hospital stay. Once you are discharged, your primary care physician will handle any further medical issues. Please note that NO REFILLS for any discharge medications will be authorized once you are discharged, as it is imperative that you return to your primary care physician (or establish a relationship with a primary care physician if you do not have one) for your aftercare needs so that they can reassess your need for medications and monitor your lab values.  The results of significant diagnostics from this hospitalization (including imaging, microbiology, ancillary and laboratory) are listed below for reference.    Significant Diagnostic Studies:  Ct Abdomen Pelvis W Contrast  08/28/2015  CLINICAL DATA:  Epigastric pain EXAM: CT ABDOMEN AND PELVIS WITH CONTRAST TECHNIQUE: Multidetector CT imaging of the abdomen and pelvis was performed using the standard protocol following bolus administration of intravenous contrast. CONTRAST:  ISOVUE-300 IOPAMIDOL (ISOVUE-300) INJECTION 61% COMPARISON:  12/18/2009 FINDINGS: Lower chest: The lung bases are clear. No pleural or pericardial effusion noted. Hepatobiliary: No suspicious liver abnormalities identified. The gallbladder appears normal. There is no biliary dilatation. Pancreas: No mass, inflammatory changes, or other significant abnormality. Spleen: Within normal limits in size and appearance. Adrenals/Urinary Tract: The adrenal glands are normal. Unremarkable appearance of both kidneys. The urinary bladder is within normal limits. Stomach/Bowel: The stomach appears normal. The proximal small bowel loops are unremarkable. There is increase caliber of the mid small bowel loops which measure up to 3.5 cm. Transition point is within the central abdomen where there is  an abnormal loop of small bowel which exhibits abnormal mucosal enhanced and wall thickening with surrounding inflammation/ fat stranding. There is associated luminal narrowing. The affected right lower quadrant bowel loops appeared tethered, image 74 series 2. 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 Small lymph nodes are identified within the surrounding mesenteric fat. Bowel wall thickness measures up to 8 mm, image 34 of series 3. Decreased caliber of the terminal ileum. Unremarkable appearance of the colon. Vascular/Lymphatic: Normal appearance of the abdominal aorta. No enlarged retroperitoneal or mesenteric adenopathy. No enlarged pelvic or inguinal lymph nodes. Reproductive: No mass or other significant abnormality. Other: There is a moderate amount of free fluid identified within the pelvis. At this time there are no fluid collections which would be amendable to drainage. Musculoskeletal: There is a well defined sclerotic focus within the right iliac bone. This has a nonaggressive appearance and likely reflects benign abnormality. IMPRESSION: 1. Small bowel obstruction. The transition point is within the right lower quadrant of the abdomen, likely mid to distal ileum. 2. Abnormal appearance of right lower quadrant small bowel loops which exhibit wall thickening, mucosal enhanced, luminal narrowing, and surrounding inflammation. Findings are worrisome for scratch set findings are compatible with enteritis. This may be inflammatory or infectious in etiology. Crohn's disease is not excluded. 3. Extraluminal collection of gas and phlegmon with surrounding inflammation is identified within the right lower quadrant and is adjacent to the abnormal appearing small bowel loops. Although this may reflect bowel perforation, in the setting of inflammatory bowel disease this would be indicative of  penetrating disease. These results were called by telephone at the time of interpretation on 08/28/2015 at 2:17 pm to Dr. Blair Heys , who verbally acknowledged these results. Electronically Signed   By: Signa Kell M.D.   On: 08/28/2015 14:19    Microbiology: Recent Results (from the past 240 hour(s))  Culture, blood (Routine X 2) w Reflex to ID Panel     Status: None (Preliminary result)   Collection Time: 08/28/15  6:24 PM  Result Value Ref Range Status   Specimen Description BLOOD LEFT ANTECUBITAL  Final   Special Requests BOTTLES DRAWN AEROBIC AND ANAEROBIC 10CC  Final   Culture NO GROWTH 3 DAYS  Final   Report Status PENDING  Incomplete  Culture, blood (Routine X 2) w Reflex to ID Panel     Status: None (Preliminary result)   Collection Time: 08/28/15  6:33 PM  Result Value Ref Range Status   Specimen Description  BLOOD RIGHT ANTECUBITAL  Final   Special Requests BOTTLES DRAWN AEROBIC AND ANAEROBIC 5CC  Final   Culture NO GROWTH 3 DAYS  Final   Report Status PENDING  Incomplete    Labs: Basic Metabolic Panel:  Recent Labs Lab 08/28/15 1702 08/29/15 0419 08/31/15 0359  NA 133* 140 139  K 3.3* 3.7 4.2  CL 101 106 108  CO2 27 27 27   GLUCOSE 119* 92 122*  BUN 9 8 8   CREATININE 1.28* 1.32* 1.13  CALCIUM 9.1 8.8* 8.7*   Liver Function Tests:  Recent Labs Lab 08/28/15 1702 08/29/15 0419 08/31/15 0359  AST 14* 12* 10*  ALT 14* 11* 10*  ALKPHOS 65 55 49  BILITOT 0.4 0.6 0.4  PROT 6.3* 5.9* 5.4*  ALBUMIN 3.4* 3.1* 2.9*   No results for input(s): LIPASE, AMYLASE in the last 168 hours. No results for input(s): AMMONIA in the last 168 hours. CBC:  Recent Labs Lab 08/28/15 1702 08/29/15 0419 08/31/15 0359  WBC 6.8 7.0  --   NEUTROABS 4.2  --   --   HGB 14.8 13.9 13.1  HCT 43.8 41.8 39.6  MCV 84.9 85.3  --   PLT 219 213  --    Cardiac Enzymes: No results for input(s): CKTOTAL, CKMB, CKMBINDEX, TROPONINI in the last 168 hours. BNP: BNP (last 3  results) No results for input(s): BNP in the last 8760 hours.  ProBNP (last 3 results) No results for input(s): PROBNP in the last 8760 hours.  CBG: No results for input(s): GLUCAP in the last 168 hours.  Signed:  Standley Dakins, MD [facility] Pager [phone]  If 7PM-7AM, please contact night-coverage [email or web address] Password [facility] 09/01/2015, 4:04 PM

## 2015-09-15 ENCOUNTER — Encounter: Payer: Self-pay | Admitting: Physician Assistant

## 2015-09-15 ENCOUNTER — Ambulatory Visit (INDEPENDENT_AMBULATORY_CARE_PROVIDER_SITE_OTHER): Payer: 59 | Admitting: Physician Assistant

## 2015-09-15 ENCOUNTER — Telehealth: Payer: Self-pay | Admitting: Physician Assistant

## 2015-09-15 VITALS — BP 128/82 | HR 68 | Ht 68.75 in | Wt 221.4 lb

## 2015-09-15 DIAGNOSIS — R1031 Right lower quadrant pain: Secondary | ICD-10-CM | POA: Diagnosis not present

## 2015-09-15 DIAGNOSIS — Z23 Encounter for immunization: Secondary | ICD-10-CM

## 2015-09-15 DIAGNOSIS — Z8719 Personal history of other diseases of the digestive system: Secondary | ICD-10-CM | POA: Diagnosis not present

## 2015-09-15 MED ORDER — PREDNISONE 10 MG PO TABS: ORAL_TABLET | ORAL | Status: DC

## 2015-09-15 NOTE — Addendum Note (Signed)
Addended byDerry Skill on: 09/15/2015 01:32 PM   Modules accepted: Orders

## 2015-09-15 NOTE — Progress Notes (Signed)
Patient ID: Luis Pineda, male   DOB: January 05, 1981, 35 y.o.   MRN: [id number]   Subjective:    Patient ID: Luis Pineda, male    DOB: 09/15/1980, 35 y.o.   MRN: [id number]  HPI Luis Pineda is a pleasant 35 year old white male, recently known to Dr. Adela Lank who was seen in consultation at the time of recent hospitalization for partial small obstruction. Patient had presented with acute abdominal pain primarily right-sided and nausea and vomiting. CT of the abdomen and pelvis on admission showed a significant inflammatory process of the distal ileum causing partial obstruction and concerning for Crohn's. He was treated with IV Solu-Medrol and bowel rest and had significant improvement. He was also given a course of Cipro and Flagyl. He was discharged about 2 weeks ago on prednisone 40 mg by mouth daily with plans for follow-up and outpatient colonoscopy. QuantiFERON Gold and hepatitis serologies were done in anticipation of need for biologic and both these are negative. He has completed his antibiotics and says today is his last day on 40 mg of prednisone. He's been feeling okay but says he hasn't had much of an appetite and has been eating much less than usual. He says he doesn't feel like he can tolerate any heavy food as he has had some ongoing bloating and a lot of "gurgling" in his abdomen.'s having bowel movements have which have been loose no melena or hematochezia. He has had some nausea off and on ,but no vomiting since discharge from the hospital. He tells me he's usually been eating eggs for breakfast in generally rice pudding or something similarly light for lunch and dinner. His weight is down about 10 pounds. He is anxious to move forward with procedure.  Review of Systems Pertinent positive and negative review of systems were noted in the above HPI section.  All other review of systems was otherwise negative.  Outpatient Encounter Prescriptions as of 09/15/2015  Medication Sig  . predniSONE  (DELTASONE) 20 MG tablet Take 2 tablets (40 mg total) by mouth daily with breakfast.  . [DISCONTINUED] ciprofloxacin (CIPRO) 500 MG tablet Take 1 tablet (500 mg total) by mouth 2 (two) times daily.  . [DISCONTINUED] metroNIDAZOLE (FLAGYL) 500 MG tablet Take 1 tablet (500 mg total) by mouth 2 (two) times daily with a meal. DO NOT CONSUME ALCOHOL WHILE TAKING THIS MEDICATION.   No facility-administered encounter medications on file as of 09/15/2015.   No Known Allergies Patient Active Problem List   Diagnosis Date Noted  . SBO (small bowel obstruction) (HCC) 08/28/2015  . Generalized abdominal pain 08/28/2015  . RLQ abdominal pain 08/28/2015  . Nausea and vomiting 08/28/2015  . Enteritis 08/28/2015   Social History   Social History  . Marital Status: Unknown    Spouse Name: N/A  . Number of Children: N/A  . Years of Education: N/A   Occupational History  . Not on file.   Social History Main Topics  . Smoking status: Never Smoker   . Smokeless tobacco: Never Used  . Alcohol Use: Yes     Comment: OCCASIONAL  . Drug Use: No  . Sexual Activity: Not on file   Other Topics Concern  . Not on file   Social History Narrative    Luis Pineda family history includes Breast cancer in his mother; Melanoma in his father.      Objective:    Filed Vitals:   09/15/15 0959  BP: 128/82  Pulse: 68    Physical Exam  well-developed healthy-appearing white male in no acute distress, pleasant blood pressure 128/82, pulse 68 height 5 foot 8 weight 221 BMI 32. HEENT; nontraumatic normocephalic EOMI PERRLA sclera anicteric, Cardiovascular; regular rate and rhythm with S1-S2 no murmur or gallop, Pulmonary; clear bilaterally, Abdomen ;soft, nondistended bowel sounds are active is tender in the right mid and right lower quadrant there is no guarding or rebound no palpable mass or hepatosplenomegaly, Rectal ;exam not done, Ext; no clubbing cyanosis or edema skin warm and dry, Neuropsych ;mood and  affect appropriate    Assessment & Plan:   #[age] -year-old white male with recent hospitalization for partial small bowel obstruction with extensive inflammatory process of the distal ileum on CT concerning for Crohn's disease. Patient has responded to steroids but remains symptomatic with right lower quadrant pain, intermittent bloating and nausea and poor appetite.  Plan; decrease prednisone to 30 mg by mouth daily 2 weeks then plan to decrease by 5 mg per week until tapered off Full liquid to very soft diet but have asked himto try to push to eat a bit more than he has been Patient will undergo colonoscopy with Dr. Adela Lank tomorrow, as spot has become available. Procedure discussed in detail with patient including risks and benefits and he is agreeable to proceed. Have advised him to be vaccinated for influenza and patient was given a Pneumovax today here in the office. Anticipated he will require a biologic once diagnosis of Crohn's is confirmed.Luis Both Esterwood PA-C 09/15/2015   Cc: Blair Heys, MD

## 2015-09-15 NOTE — Patient Instructions (Signed)
On the Prednisone, Take 30 mg every mornhing x 14 days then decrease by 5 mg weekly.                                    Then 25 mg x 7 days                                    Then 20 mg x 7 days                                   Then 15 mg x 7 days                                    Then 10 mg x 7 days                                    Then 5 mg x 7 days.  We have given you a Pneumococcal Vaccine Polyvalent (23 ).  You have been scheduled for a colonoscopy. Please follow written instructions given to you at your visit today.  Please pick up your prep supplies at the pharmacy within the next 1-3 days. If you use inhalers (even only as needed), please bring them with you on the day of your procedure. Your physician has requested that you go to www.startemmi.com and enter the access code given to you at your visit today. This web site gives a general overview about your procedure. However, you should still follow specific instructions given to you by our office regarding your preparation for the procedure.

## 2015-09-15 NOTE — Progress Notes (Signed)
I agree with assessment and plan as outlined. We will await colonoscopy to be done tomorrow.

## 2015-09-16 ENCOUNTER — Telehealth: Payer: Self-pay | Admitting: *Deleted

## 2015-09-16 ENCOUNTER — Ambulatory Visit (AMBULATORY_SURGERY_CENTER): Payer: 59 | Admitting: Gastroenterology

## 2015-09-16 ENCOUNTER — Encounter: Payer: Self-pay | Admitting: Gastroenterology

## 2015-09-16 VITALS — BP 113/72 | HR 48 | Temp 97.6°F | Resp 12 | Ht 68.0 in | Wt 221.0 lb

## 2015-09-16 DIAGNOSIS — Z8719 Personal history of other diseases of the digestive system: Secondary | ICD-10-CM | POA: Diagnosis not present

## 2015-09-16 DIAGNOSIS — R935 Abnormal findings on diagnostic imaging of other abdominal regions, including retroperitoneum: Secondary | ICD-10-CM

## 2015-09-16 HISTORY — PX: COLONOSCOPY: SHX174

## 2015-09-16 MED ORDER — SODIUM CHLORIDE 0.9 % IV SOLN: 500.0000 mL | INTRAVENOUS | Status: DC

## 2015-09-16 NOTE — Telephone Encounter (Signed)
-----   Message from Ruffin Frederick, MD sent at 09/16/2015  9:10 AM EDT ----- Luis Pineda would you mind calling this patient today and scheduling him for an MR enterography to rule out Crohns? He is quite anxious about this after his procedure today and wants it as soon as possible. If MRI is going to take along time to get, we can get CT enterography, but I would prefer to the MRI to minimize his radiation exposure. Thanks

## 2015-09-16 NOTE — Telephone Encounter (Signed)
Scheduled MR enterography at [facility] on 09/23/15 at 9:00 AM. NPO after midnight. Patient given appointment date, time and instructions.

## 2015-09-16 NOTE — Patient Instructions (Signed)
Diverticulosis seen today, biopsies taken today. Result letter in your mail in 2-3 weeks.  MRI to be scheduled. MD's office nurse will call you with that appointment.  Resume current medications. Call us with any questions or concerns. Thank you!   YOU HAD AN ENDOSCOPIC PROCEDURE TODAY AT THE [facility]:   Refer to the procedure report that was given to you for any specific questions about what was found during the examination.  If the procedure report does not answer your questions, please call your gastroenterologist to clarify.  If you requested that your care partner not be given the details of your procedure findings, then the procedure report has been included in a sealed envelope for you to review at your convenience later.  YOU SHOULD EXPECT: Some feelings of bloating in the abdomen. Passage of more gas than usual.  Walking can help get rid of the air that was put into your GI tract during the procedure and reduce the bloating. If you had a lower endoscopy (such as a colonoscopy or flexible sigmoidoscopy) you may notice spotting of blood in your stool or on the toilet paper. If you underwent a bowel prep for your procedure, you may not have a normal bowel movement for a few days.  Please Note:  You might notice some irritation and congestion in your nose or some drainage.  This is from the oxygen used during your procedure.  There is no need for concern and it should clear up in a day or so.  SYMPTOMS TO REPORT IMMEDIATELY:   Following lower endoscopy (colonoscopy or flexible sigmoidoscopy):  Excessive amounts of blood in the stool  Significant tenderness or worsening of abdominal pains  Swelling of the abdomen that is new, acute  Fever of 100F or higher  For urgent or emergent issues, a gastroenterologist can be reached at any hour by calling [phone].   DIET: Your first meal following the procedure should be a small meal and then it is ok to progress to your  normal diet. Heavy or fried foods are harder to digest and may make you feel nauseous or bloated.  Likewise, meals heavy in dairy and vegetables can increase bloating.  Drink plenty of fluids but you should avoid alcoholic beverages for 24 hours.  ACTIVITY:  You should plan to take it easy for the rest of today and you should NOT DRIVE or use heavy machinery until tomorrow (because of the sedation medicines used during the test).    FOLLOW UP: Our staff will call the number listed on your records the next business day following your procedure to check on you and address any questions or concerns that you may have regarding the information given to you following your procedure. If we do not reach you, we will leave a message.  However, if you are feeling well and you are not experiencing any problems, there is no need to return our call.  We will assume that you have returned to your regular daily activities without incident.  If any biopsies were taken you will be contacted by phone or by letter within the next 1-3 weeks.  Please call us at [phone] if you have not heard about the biopsies in 3 weeks.    SIGNATURES/CONFIDENTIALITY: You and/or your care partner have signed paperwork which will be entered into your electronic medical record.  These signatures attest to the fact that that the information above on your After Visit Summary has been reviewed and is  understood.  Full responsibility of the confidentiality of this discharge information lies with you and/or your care-partner. 

## 2015-09-16 NOTE — Op Note (Signed)
[facility] Patient Name: Luis Pineda Procedure Date: 09/16/2015 7:36 AM MRN: [id number] Endoscopist: Viviann Spare P. Adela Lank , MD Age: 35 Referring MD:  Date of Birth: [birth year] Gender: Male Account #: [id number] Procedure:                Colonoscopy Indications:              Obtain more precise diagnosis of inflammatory bowel                            disease (CT suggestive of ileal Crohns disease)                            This is the patient's first colonoscopy Medicines:                Monitored Anesthesia Care Procedure:                Pre-Anesthesia Assessment:                           - Prior to the procedure, a History and Physical                            was performed, and patient medications and                            allergies were reviewed. The patient's tolerance of                            previous anesthesia was also reviewed. The risks                            and benefits of the procedure and the sedation                            options and risks were discussed with the patient.                            All questions were answered, and informed consent                            was obtained. Prior Anticoagulants: The patient has                            taken no previous anticoagulant or antiplatelet                            agents. ASA Grade Assessment: II - A patient with                            mild systemic disease. After reviewing the risks                            and benefits, the patient was deemed in  satisfactory condition to undergo the procedure.                           After obtaining informed consent, the colonoscope                            was passed under direct vision. Throughout the                            procedure, the patient's blood pressure, pulse, and                            oxygen saturations were monitored continuously. The                            Model CF-HQ190L  [id number]) scope was introduced                            through the anus and advanced to the the terminal                            ileum, with identification of the appendiceal                            orifice and IC valve. The colonoscopy was performed                            without difficulty. The patient tolerated the                            procedure well. The quality of the bowel                            preparation was excellent. The terminal ileum,                            ileocecal valve, appendiceal orifice, and rectum                            were photographed. Scope In: 8:08:17 AM Scope Out: 8:21:46 AM Scope Withdrawal Time: 0 hours 11 minutes 18 seconds  Total Procedure Duration: 0 hours 13 minutes 29 seconds  Findings:                 The perianal and digital rectal examinations were                            normal.                           The terminal ileum was deeply intubated (roughly                            30cm) and appeared normal. This was biopsied with a  cold forceps for histology.                           A few small-mouthed diverticula were found in the                            sigmoid colon.                           The exam was otherwise without abnormality on                            direct and retroflexion views. Complications:            No immediate complications. Estimated blood loss:                            Minimal. Estimated Blood Loss:     Estimated blood loss was minimal. Impression:               - The examined portion of the ileum was normal.                            Biopsied.                           - Diverticulosis in the sigmoid colon.                           - The examination was otherwise normal on direct                            and retroflexion views.                           Overall, I suspect the area of concern previously                            noted on CT scan  was not able to be reached with                            ileal intubation / colonoscopy Recommendation:           - Patient has a contact number available for                            emergencies. The signs and symptoms of potential                            delayed complications were discussed with the                            patient. Return to normal activities tomorrow.                            Written discharge instructions were provided to the  patient.                           - Resume previous diet.                           - Continue present medications.                           - Await pathology results.                           - Recommend MR enterography to further evaluate the                            small bowel, assess for interval changes Pammy Vesey P. Adela Lank, MD 09/16/2015 8:27:28 AM This report has been signed electronically.

## 2015-09-16 NOTE — Progress Notes (Signed)
Called to room to assist during endoscopic procedure.  Patient ID and intended procedure confirmed with present staff. Received instructions for my participation in the procedure from the performing physician.  

## 2015-09-17 ENCOUNTER — Telehealth: Payer: Self-pay | Admitting: *Deleted

## 2015-09-17 NOTE — Telephone Encounter (Signed)
Pt procedure was moved to a sooner appt pt did arrive

## 2015-09-17 NOTE — Telephone Encounter (Signed)
No answer, message left for the patient. 

## 2015-09-18 ENCOUNTER — Other Ambulatory Visit: Payer: Self-pay

## 2015-09-18 DIAGNOSIS — R935 Abnormal findings on diagnostic imaging of other abdominal regions, including retroperitoneum: Secondary | ICD-10-CM

## 2015-09-23 ENCOUNTER — Ambulatory Visit (HOSPITAL_COMMUNITY)
Admission: RE | Admit: 2015-09-23 | Discharge: 2015-09-23 | Disposition: A | Discharge status: Home / Self Care | Payer: 59 | Source: Ambulatory Visit | Attending: Gastroenterology | Admitting: Gastroenterology

## 2015-09-23 ENCOUNTER — Ambulatory Visit (HOSPITAL_COMMUNITY): Payer: 59

## 2015-09-23 DIAGNOSIS — K529 Noninfective gastroenteritis and colitis, unspecified: Secondary | ICD-10-CM | POA: Insufficient documentation

## 2015-09-23 DIAGNOSIS — R935 Abnormal findings on diagnostic imaging of other abdominal regions, including retroperitoneum: Secondary | ICD-10-CM | POA: Insufficient documentation

## 2015-09-23 MED ORDER — GADOBENATE DIMEGLUMINE 529 MG/ML IV SOLN
20.0000 mL | Freq: Once | INTRAVENOUS | Status: AC | PRN
  Administered 2015-09-23: 20 mL via INTRAVENOUS

## 2015-09-30 ENCOUNTER — Telehealth: Payer: Self-pay | Admitting: Gastroenterology

## 2015-09-30 DIAGNOSIS — R109 Unspecified abdominal pain: Secondary | ICD-10-CM

## 2015-09-30 MED ORDER — DICYCLOMINE HCL 20 MG PO TABS: 20.0000 mg | ORAL_TABLET | Freq: Three times a day (TID) | ORAL | Status: DC

## 2015-09-30 NOTE — Telephone Encounter (Signed)
Dr Adela Lank can you please advise on any further recommendations for this pt, he is very anxious and states he has lost more weight and has severe abd pain.  He doesn't feel as if he can wait until 10/07/15 to see Dr Maisie Fus.

## 2015-09-30 NOTE — Telephone Encounter (Signed)
We can try adding bentyl 20mg  q 8 hours to see if this helps. He is pending a surgical evaluation for small bowel resection. Any way he can get in to see surgery sooner? Is he able to tolerate PO without vomiting? We may want to repeat a CBC given it has been a while since it has been done. I will have only intermittent access to my results over the next few days as I am not in the office, if there is an abnormality on his labs please have one of the other providers review it. Thanks

## 2015-09-30 NOTE — Telephone Encounter (Signed)
Pt has been advised that the prescription has been sent and lab has been added to EPIC.

## 2015-09-30 NOTE — Telephone Encounter (Signed)
The pt is very anxious about his weight loss and abd pain, he wants to know if [facility] was able to get him in any sooner.  Per Jesse Fall RN,; Notes Recorded by Daphine Deutscher, RN on 09/24/2015 at 1:53 PM Spoke with the triage nurse at [facility] and the earliest OV is on 10/07/15 with Dr. Maisie Fus. She will talk with Dr Maurine Minister nurse and see if she can get patient in earlier. Patient aware.  The pt was advised to call [facility] and see if the triage nurse was able to work him in any sooner that 10/07/15.  The pt agreed

## 2015-09-30 NOTE — Telephone Encounter (Signed)
To amend the note below, I called the patient to reassess him but no answer, and I left him left him a message with these instructions as he did not answer - liquid diet, consider increasing prednisone back to 40mg  if no fevers but should get labs tomorrow and call clinic for results, may also consider antibiotics. If he can't tolerate PO or has vomiting with concerns for obstruction he may have to go to the ER for imaging to rule out obstruction

## 2015-09-30 NOTE — Telephone Encounter (Signed)
Luis Pineda can you advise this patient to go to a liquid diet and see if this makes him feel any better. I spoke with Dr. Maisie Fus about his case previously and we had hoped to wean him down on his steroids before his surgical evaluation, although if his symptoms are worsening with prendisone taper we may have to increase his prednisone back to . If he is having fevers he should let us know and we may need to repeat imaging to ensure no abscess. If he is having fevers we should add back antibiotics, cipro and flagyl as well.

## 2015-10-01 ENCOUNTER — Emergency Department (HOSPITAL_COMMUNITY): Payer: 59

## 2015-10-01 ENCOUNTER — Other Ambulatory Visit (INDEPENDENT_AMBULATORY_CARE_PROVIDER_SITE_OTHER): Payer: 59

## 2015-10-01 ENCOUNTER — Encounter (HOSPITAL_COMMUNITY): Payer: Self-pay | Admitting: Emergency Medicine

## 2015-10-01 ENCOUNTER — Inpatient Hospital Stay (HOSPITAL_COMMUNITY)
Admission: EM | Admit: 2015-10-01 | Discharge: 2015-10-07 | DRG: 345 | Disposition: A | Discharge status: Home / Self Care | Payer: 59 | Attending: Internal Medicine | Admitting: Internal Medicine

## 2015-10-01 DIAGNOSIS — Z803 Family history of malignant neoplasm of breast: Secondary | ICD-10-CM | POA: Diagnosis not present

## 2015-10-01 DIAGNOSIS — R109 Unspecified abdominal pain: Secondary | ICD-10-CM | POA: Diagnosis present

## 2015-10-01 DIAGNOSIS — R634 Abnormal weight loss: Secondary | ICD-10-CM

## 2015-10-01 DIAGNOSIS — K6389 Other specified diseases of intestine: Secondary | ICD-10-CM | POA: Diagnosis not present

## 2015-10-01 DIAGNOSIS — R1084 Generalized abdominal pain: Secondary | ICD-10-CM

## 2015-10-01 DIAGNOSIS — K56609 Unspecified intestinal obstruction, unspecified as to partial versus complete obstruction: Secondary | ICD-10-CM | POA: Diagnosis present

## 2015-10-01 DIAGNOSIS — K567 Ileus, unspecified: Secondary | ICD-10-CM | POA: Diagnosis not present

## 2015-10-01 DIAGNOSIS — R197 Diarrhea, unspecified: Secondary | ICD-10-CM

## 2015-10-01 DIAGNOSIS — Z808 Family history of malignant neoplasm of other organs or systems: Secondary | ICD-10-CM | POA: Diagnosis not present

## 2015-10-01 DIAGNOSIS — K5669 Other intestinal obstruction: Secondary | ICD-10-CM | POA: Diagnosis not present

## 2015-10-01 DIAGNOSIS — Q430 Meckel's diverticulum (displaced) (hypertrophic): Secondary | ICD-10-CM | POA: Diagnosis not present

## 2015-10-01 DIAGNOSIS — R1031 Right lower quadrant pain: Secondary | ICD-10-CM | POA: Diagnosis not present

## 2015-10-01 DIAGNOSIS — Z5331 Laparoscopic surgical procedure converted to open procedure: Secondary | ICD-10-CM

## 2015-10-01 DIAGNOSIS — R001 Bradycardia, unspecified: Secondary | ICD-10-CM | POA: Diagnosis present

## 2015-10-01 DIAGNOSIS — L0291 Cutaneous abscess, unspecified: Secondary | ICD-10-CM

## 2015-10-01 HISTORY — DX: Meckel's diverticulum (displaced) (hypertrophic): Q43.0

## 2015-10-01 LAB — CBC WITH DIFFERENTIAL/PLATELET
BASOS ABS: 0 10*3/uL (ref 0.0–0.1)
BASOS PCT: 0.2 % (ref 0.0–3.0)
EOS PCT: 0.5 % (ref 0.0–5.0)
Eosinophils Absolute: 0.1 10*3/uL (ref 0.0–0.7)
HEMATOCRIT: 42 % (ref 39.0–52.0)
Hemoglobin: 14.4 g/dL (ref 13.0–17.0)
LYMPHS PCT: 20.4 % (ref 12.0–46.0)
Lymphs Abs: 2.1 10*3/uL (ref 0.7–4.0)
MCHC: 34.2 g/dL (ref 30.0–36.0)
MCV: 85.9 fl (ref 78.0–100.0)
MONOS PCT: 7 % (ref 3.0–12.0)
Monocytes Absolute: 0.7 10*3/uL (ref 0.1–1.0)
NEUTROS ABS: 7.4 10*3/uL (ref 1.4–7.7)
Neutrophils Relative %: 71.9 % (ref 43.0–77.0)
PLATELETS: 219 10*3/uL (ref 150.0–400.0)
RBC: 4.89 Mil/uL (ref 4.22–5.81)
RDW: 13.7 % (ref 11.5–15.5)
WBC: 10.3 10*3/uL (ref 4.0–10.5)

## 2015-10-01 LAB — URINALYSIS, ROUTINE W REFLEX MICROSCOPIC
Bilirubin Urine: NEGATIVE
Glucose, UA: NEGATIVE mg/dL
Hgb urine dipstick: NEGATIVE
Ketones, ur: NEGATIVE mg/dL
Leukocytes, UA: NEGATIVE
Nitrite: NEGATIVE
Protein, ur: NEGATIVE mg/dL
Specific Gravity, Urine: 1.02 (ref 1.005–1.030)
pH: 6 (ref 5.0–8.0)

## 2015-10-01 LAB — APTT: aPTT: 29 seconds (ref 24–37)

## 2015-10-01 LAB — COMPREHENSIVE METABOLIC PANEL
ALT: 26 U/L (ref 17–63)
AST: 13 U/L — ABNORMAL LOW (ref 15–41)
Albumin: 4.2 g/dL (ref 3.5–5.0)
Alkaline Phosphatase: 56 U/L (ref 38–126)
Anion gap: 6 (ref 5–15)
BUN: 8 mg/dL (ref 6–20)
CO2: 29 mmol/L (ref 22–32)
Calcium: 9.1 mg/dL (ref 8.9–10.3)
Chloride: 104 mmol/L (ref 101–111)
Creatinine, Ser: 1.06 mg/dL (ref 0.61–1.24)
GFR calc Af Amer: >60 mL/min (ref >60)
GFR calc non Af Amer: >60 mL/min (ref >60)
Glucose, Bld: 106 mg/dL — ABNORMAL HIGH (ref 65–99)
Potassium: 3.6 mmol/L (ref 3.5–5.1)
Sodium: 139 mmol/L (ref 135–145)
Total Bilirubin: 1.3 mg/dL — ABNORMAL HIGH (ref 0.3–1.2)
Total Protein: 7 g/dL (ref 6.5–8.1)

## 2015-10-01 LAB — CBC
HCT: 44.4 % (ref 39.0–52.0)
Hemoglobin: 14.8 g/dL (ref 13.0–17.0)
MCH: 29.5 pg (ref 26.0–34.0)
MCHC: 33.3 g/dL (ref 30.0–36.0)
MCV: 88.6 fL (ref 78.0–100.0)
Platelets: 223 10*3/uL (ref 150–400)
RBC: 5.01 MIL/uL (ref 4.22–5.81)
RDW: 13 % (ref 11.5–15.5)
WBC: 11.2 10*3/uL — ABNORMAL HIGH (ref 4.0–10.5)

## 2015-10-01 LAB — LIPASE, BLOOD: Lipase: 26 U/L (ref 11–51)

## 2015-10-01 LAB — PREALBUMIN: Prealbumin: 28.3 mg/dL (ref 18–38)

## 2015-10-01 IMAGING — CT CT ABD-PELV W/ CM
2 of 4 series · 16 of 46 positions shown, 18 images · IV contrast (ISOVUE)
Comparison: [DATE]

CLINICAL DATA: Abdominal pain and diarrhea

EXAM:
CT ABDOMEN AND PELVIS WITH CONTRAST
TECHNIQUE: Multidetector CT imaging of the abdomen and pelvis was performed
using the standard protocol following bolus administration of
intravenous contrast.
CONTRAST:  100mL [CO] IOPAMIDOL ([CO]) INJECTION 61%

[Series 2: abd/pel with · axial · 0.76mm/px · z∈[-106,+309]mm · 13 of 95 slices shown, 15 images]
[im 6/95  soft-tissue]
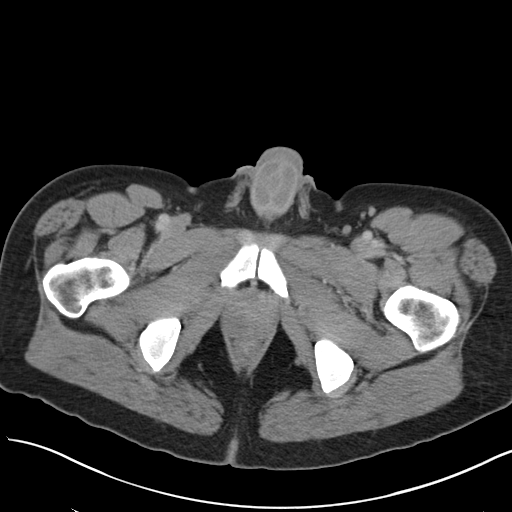
[im 6/95  bone]
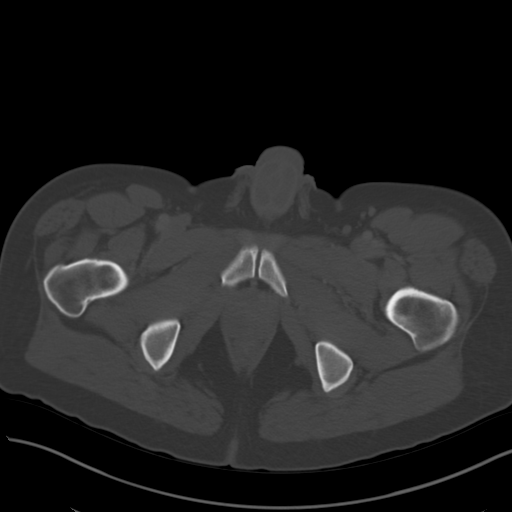
[im 11/95  soft-tissue]
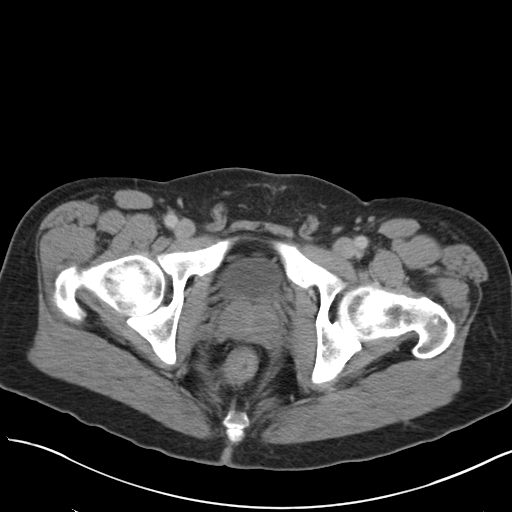
[im 21/95  soft-tissue]
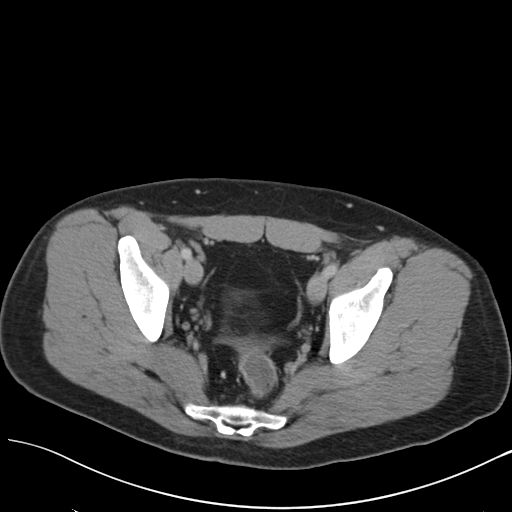
[im 27/95  soft-tissue]
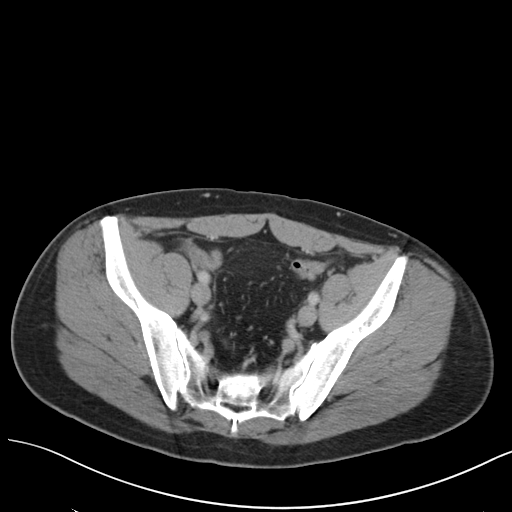
[im 32/95  soft-tissue]
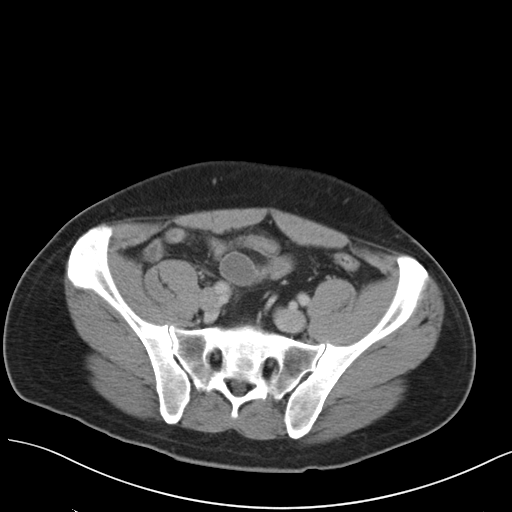
[im 42/95  soft-tissue]
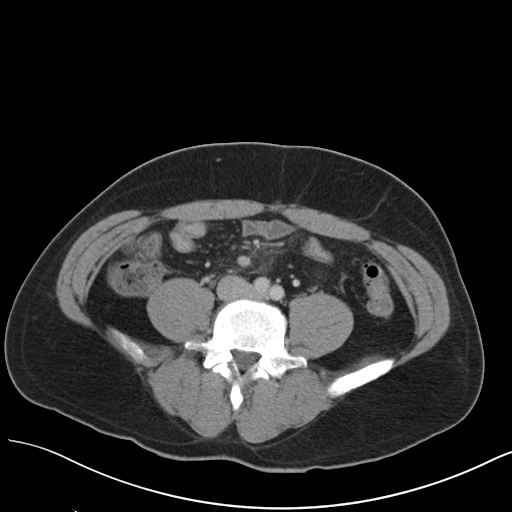
[im 48/95  soft-tissue]
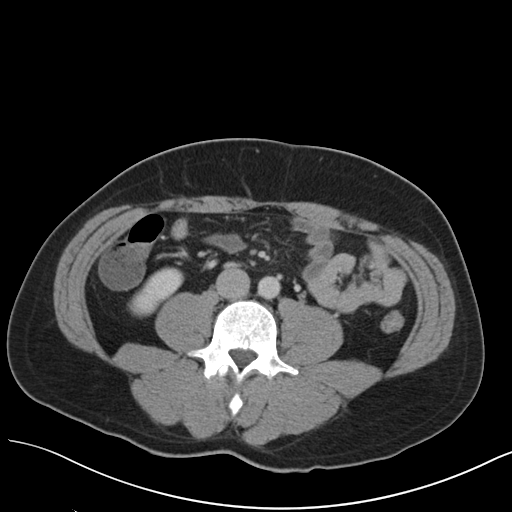
[im 53/95  soft-tissue]
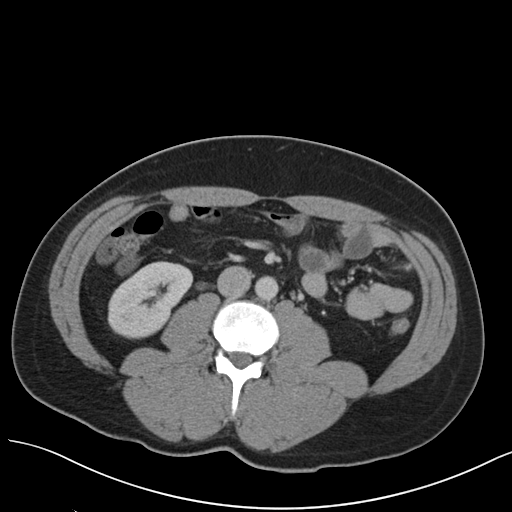
[im 63/95  soft-tissue]
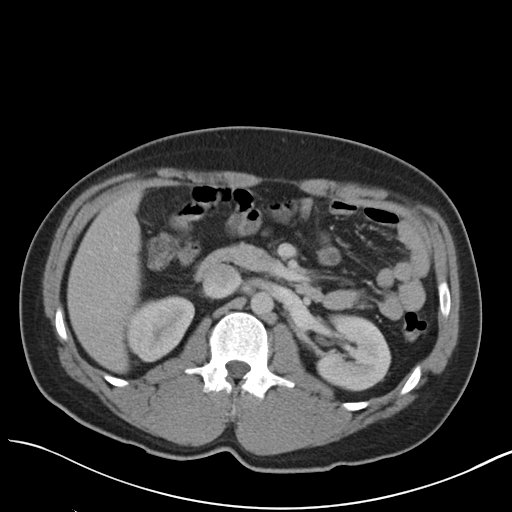
[im 63/95  bone]
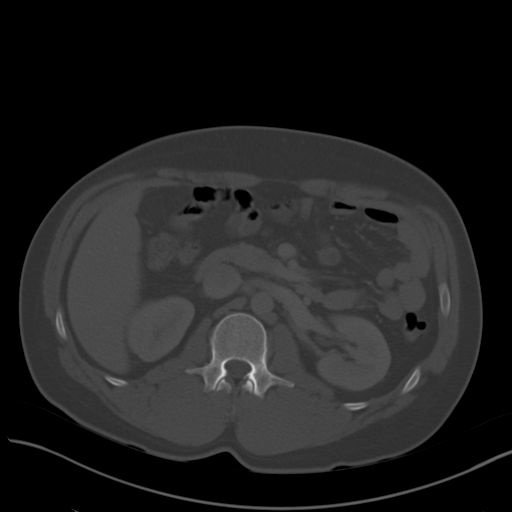
[im 68/95  soft-tissue]
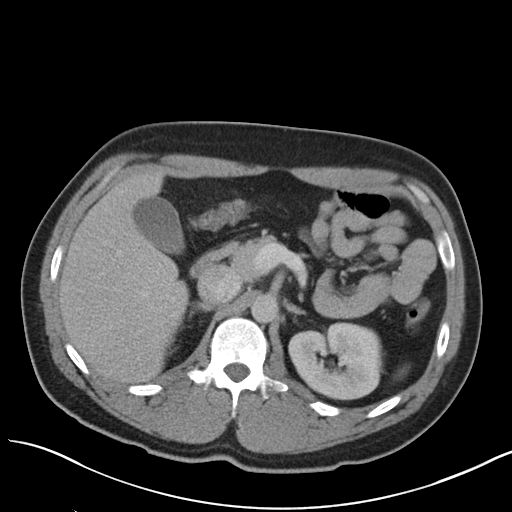
[im 74/95  soft-tissue]
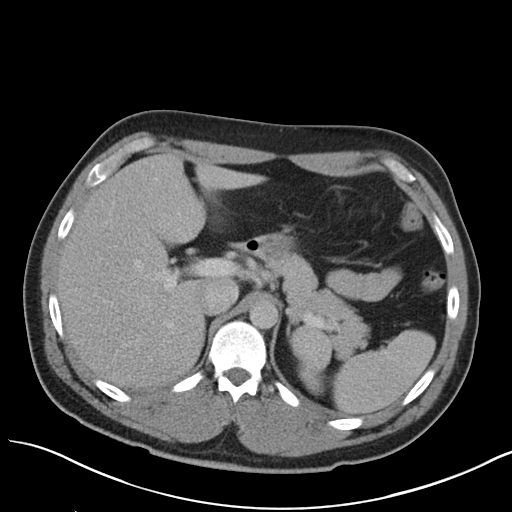
[im 84/95  soft-tissue]
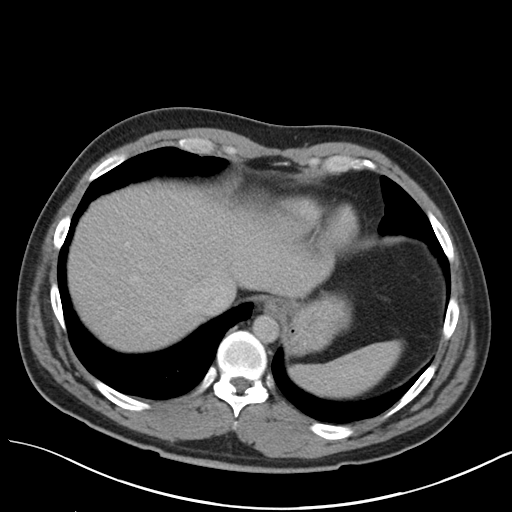
[im 89/95  soft-tissue]
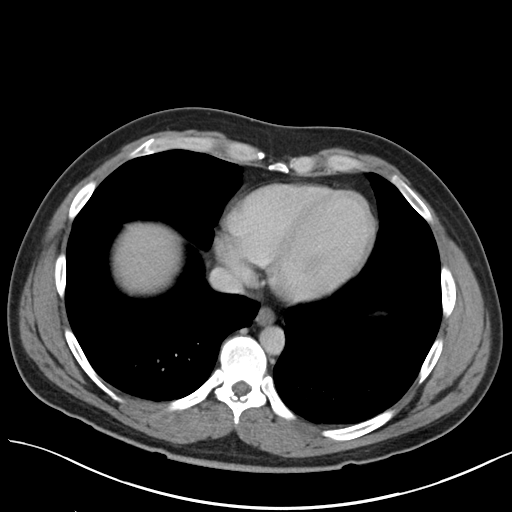

[Series 3: coronal a/|p · coronal · 0.92mm/px · 3 of 119 slices shown]
[im 40/119  soft-tissue]
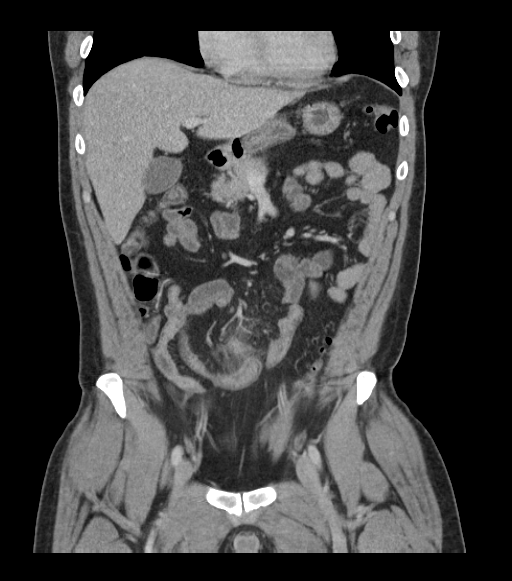
[im 53/119  soft-tissue]
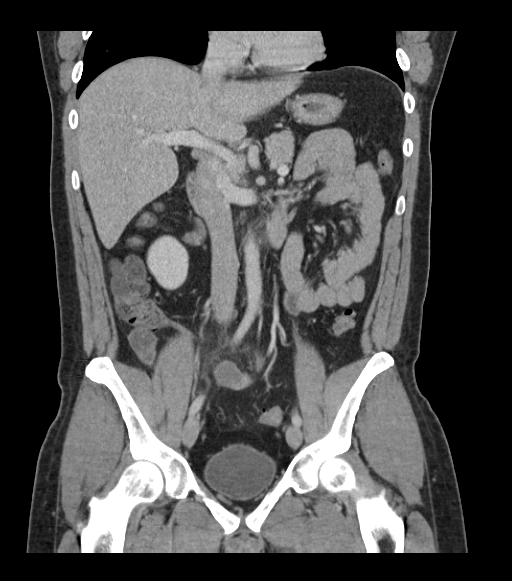
[im 66/119  soft-tissue]
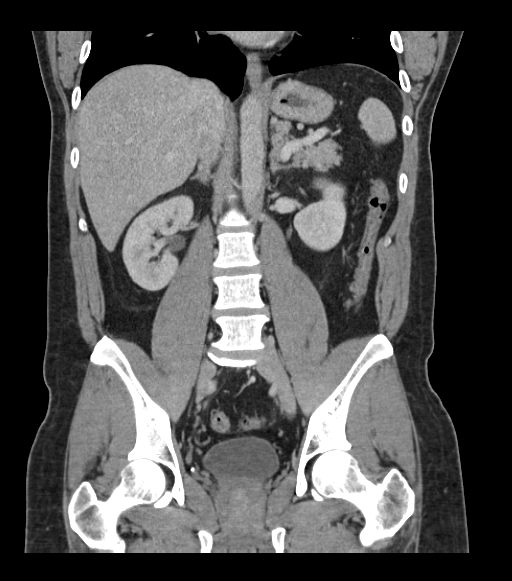

[16 of 46 positions shown; findings below may reference images not displayed]

FINDINGS: Lower chest:  No acute findings.

Hepatobiliary: No masses or other significant abnormality.

Pancreas: No mass, inflammatory changes, or other significant
abnormality.

Spleen: Within normal limits in size and appearance.

Adrenals/Urinary Tract: No masses identified. No evidence of
hydronephrosis.

Stomach/Bowel: Previously seen small bowel obstruction has resolved
in the interval. There remains however evidence of a focal fluid
collection which appears to arise from the small bowel. It is
similar to that seen on the prior exam it demonstrates some
persistent inflammatory change. This is again the hila suspicious
for underlying inflammatory bowel disease such as Crohn's. The extra
luminal air has resolved in the interval from the prior exam. These
surrounding small lymph nodes are again identified and stable.

Vascular/Lymphatic: No other pathologically enlarged lymph nodes. No
evidence of abdominal aortic aneurysm.

Reproductive: No mass or other significant abnormality.

Other: None.

Musculoskeletal:  No suspicious bone lesions identified.
IMPRESSION: Resolution of previously seen small bowel obstruction. There remains
however a a small fluid collection with associated phlegmonous
changes adjacent to the bowel loops of the mid to distal small
bowel. This again raises suspicion for inflammatory bowel disease.
The extra luminal air seen previously has resolved in the interval.

No other focal abnormality is seen.

## 2015-10-01 MED ORDER — ONDANSETRON HCL 4 MG/2ML IJ SOLN
4.0000 mg | Freq: Once | INTRAMUSCULAR | Status: AC
  Administered 2015-10-01: 4 mg via INTRAVENOUS
  Filled 2015-10-01: qty 2

## 2015-10-01 MED ORDER — METHYLPREDNISOLONE SODIUM SUCC 40 MG IJ SOLR
40.0000 mg | Freq: Three times a day (TID) | INTRAMUSCULAR | Status: AC
  Administered 2015-10-01 – 2015-10-02 (×2): 40 mg via INTRAVENOUS
  Filled 2015-10-01 (×2): qty 1

## 2015-10-01 MED ORDER — MORPHINE SULFATE (PF) 4 MG/ML IV SOLN
4.0000 mg | Freq: Once | INTRAVENOUS | Status: AC
  Administered 2015-10-01: 4 mg via INTRAVENOUS
  Filled 2015-10-01: qty 1

## 2015-10-01 MED ORDER — KCL-LACTATED RINGERS-D5W 20 MEQ/L IV SOLN
INTRAVENOUS | Status: DC
  Administered 2015-10-01 – 2015-10-06 (×9): via INTRAVENOUS
  Filled 2015-10-01 (×16): qty 1000

## 2015-10-01 MED ORDER — SODIUM CHLORIDE 0.9 % IV BOLUS (SEPSIS)
1000.0000 mL | Freq: Once | INTRAVENOUS | Status: AC
  Administered 2015-10-01: 1000 mL via INTRAVENOUS

## 2015-10-01 MED ORDER — MORPHINE SULFATE (PF) 2 MG/ML IV SOLN
2.0000 mg | Freq: Four times a day (QID) | INTRAVENOUS | Status: DC | PRN
  Administered 2015-10-01 – 2015-10-06 (×4): 2 mg via INTRAVENOUS
  Filled 2015-10-01 (×4): qty 1

## 2015-10-01 MED ORDER — PROMETHAZINE HCL 25 MG/ML IJ SOLN
12.5000 mg | Freq: Three times a day (TID) | INTRAMUSCULAR | Status: DC | PRN
  Filled 2015-10-01: qty 1

## 2015-10-01 MED ORDER — IOPAMIDOL (ISOVUE-300) INJECTION 61%
100.0000 mL | Freq: Once | INTRAVENOUS | Status: AC | PRN
  Administered 2015-10-01: 100 mL via INTRAVENOUS

## 2015-10-01 MED ORDER — HYDROMORPHONE HCL 1 MG/ML IJ SOLN
1.0000 mg | Freq: Once | INTRAMUSCULAR | Status: AC
  Administered 2015-10-01: 1 mg via INTRAVENOUS
  Filled 2015-10-01: qty 1

## 2015-10-01 NOTE — Telephone Encounter (Signed)
The pt came in for labs and walked into the clinic to discuss Dr Lanetta Inch message.  He states he is in 8/10 pain and has been on clear liquids.  Pt was advised to go to the ED for evaluation.  I will forward to Dr Adela Lank for review

## 2015-10-01 NOTE — Telephone Encounter (Signed)
Thanks for passing this one, just seeing it now. He has been seen by our inpatient service and surgery, likely needs surgical resection while inpatient. I have been in touch with Dr. Myrtie Neither about him

## 2015-10-01 NOTE — ED Provider Notes (Signed)
CSN: [id number]     Arrival date & time 10/01/15  1914 History   First MD Initiated Contact with Patient 10/01/15 1130     Chief Complaint  Patient presents with  . Abdominal Pain   HPI  Luis Pineda is a 35 y.o. male PMH significant for SBO (June 2017) presenting with a 4 day history of worsening abdominal pain. He describes his pain as 7/10 pain scale, sharp, crampy, constant, primarily right lower quadrant as well as mid epigastric, nonradiating. He has been in contact with his gastroenterologist (Dr. Adela Lank) who advised patient to consider increasing his prednisone back to 40 mg if he was afebrile, repeat labs and an ER visit for imaging to rule out obstruction if patient cannot take meds by mouth or has vomiting. Patient endorses being compliant on a soft/liquid diet (eating eggs, rice pudding, soups), which is what has been recommended by gastroenterology. He states over the last few days, he has had bowel urgency but has only produced small amounts of stool. He endorses nausea without emesis and states this pain is similar to when he presented in June for the small bowel obstruction initially. He states he has not been taking 40 mg of prednisone, because surgery and gastroenterology have told him to different things. He states surgery told him that they have been pushing back his surgery due to his steroid use. He endorses taking 20 mg of prednisone over the last week. He was told the soonest that he could have a surgery consultation was July 19. He denies fevers, chills, hematochezia, diarrhea, emesis.   Per record review, he was diagnosed with a partial small bowel obstruction in June, with a direct hospital admission (Dr. Manus Gunning) from 6/9-6/13. CT of the abdomen and pelvis on admission showed a significant inflammatory process of the distal ileum causing partial obstruction and concerning for Crohn's. He was treated with IV Solu-Medrol and bowel rest and had significant improvement. He was  also given a course of Cipro and Flagyl. He was discharged on prednisone 40 mg by mouth daily with plans for follow-up and outpatient colonoscopy. He has completed his antibiotics.   Past Medical History  Diagnosis Date  . SBO (small bowel obstruction) (HCC) 08/2015   Past Surgical History  Procedure Laterality Date  . Lasik    . Arthroscopy knee w/ drilling Right    Family History  Problem Relation Age of Onset  . GI problems    . Breast cancer Mother   . Melanoma Father    Social History  Substance Use Topics  . Smoking status: Never Smoker   . Smokeless tobacco: Never Used  . Alcohol Use: Yes     Comment: OCCASIONAL    Review of Systems  Ten systems are reviewed and are negative for acute change except as noted in the HPI   Allergies  Review of patient's allergies indicates no known allergies.  Home Medications   Prior to Admission medications   Medication Sig Start Date End Date Taking? Authorizing Provider  dicyclomine (BENTYL) 20 MG tablet Take 1 tablet (20 mg total) by mouth every 8 (eight) hours. 09/30/15  Yes Ruffin Frederick, MD  predniSONE (DELTASONE) 10 MG tablet Take 3 tabs ( 30 mg) every morning x 14 days then decrease by 5 mg weekly. Patient taking differently: Take 25 mg by mouth daily with breakfast. Take 3 tabs ( 30 mg) every morning x 14 days then decrease by 5 mg weekly. 09/15/15  Yes Amy Oswald Hillock,  PA-C  ranitidine (ZANTAC) 150 MG capsule Take 150 mg by mouth daily as needed for heartburn.   Yes Historical Provider, MD  predniSONE (DELTASONE) 20 MG tablet Take 2 tablets (40 mg total) by mouth daily with breakfast. Patient not taking: Reported on 10/01/2015 09/01/15   Clanford L Johnson, MD   BP 143/100 mmHg  Pulse 64  Temp(Src) 97.7 F (36.5 C) (Oral)  Resp 16  Ht 5\' 10"  (1.778 m)  Wt 93.441 kg  BMI 29.56 kg/m2  SpO2 100% Physical Exam  Constitutional: He appears well-developed and well-nourished. No distress.  HENT:  Head: Normocephalic  and atraumatic.  Mouth/Throat: Oropharynx is clear and moist. No oropharyngeal exudate.  Eyes: Conjunctivae are normal. Pupils are equal, round, and reactive to light. Right eye exhibits no discharge. Left eye exhibits no discharge. No scleral icterus.  Neck: No tracheal deviation present.  Cardiovascular: Normal rate, regular rhythm, normal heart sounds and intact distal pulses.  Exam reveals no gallop and no friction rub.   No murmur heard. Pulmonary/Chest: Effort normal and breath sounds normal. No respiratory distress. He has no wheezes. He has no rales. He exhibits no tenderness.  Abdominal: Soft. Bowel sounds are normal. He exhibits no distension and no mass. There is tenderness. There is guarding. There is no rebound.  Right lower quadrant tenderness with guarding  Musculoskeletal: He exhibits no edema.  Lymphadenopathy:    He has no cervical adenopathy.  Neurological: He is alert. Coordination normal.  Skin: Skin is warm and dry. No rash noted. He is not diaphoretic. No erythema.  Psychiatric: He has a normal mood and affect. His behavior is normal.  Nursing note and vitals reviewed.  ED Course  Procedures Labs Review Labs Reviewed  COMPREHENSIVE METABOLIC PANEL - Abnormal; Notable for the following:    Glucose, Bld 106 (*)    AST 13 (*)    Total Bilirubin 1.3 (*)    All other components within normal limits  CBC - Abnormal; Notable for the following:    WBC 11.2 (*)    All other components within normal limits  LIPASE, BLOOD  URINALYSIS, ROUTINE W REFLEX MICROSCOPIC (NOT AT [facility])    Imaging Review Ct Abdomen Pelvis W Contrast  10/01/2015  CLINICAL DATA:  Abdominal pain and diarrhea EXAM: CT ABDOMEN AND PELVIS WITH CONTRAST TECHNIQUE: Multidetector CT imaging of the abdomen and pelvis was performed using the standard protocol following bolus administration of intravenous contrast. CONTRAST:  ISOVUE-300 IOPAMIDOL (ISOVUE-300) INJECTION 61% COMPARISON:  08/28/2015  FINDINGS: Lower chest:  No acute findings. Hepatobiliary: No masses or other significant abnormality. Pancreas: No mass, inflammatory changes, or other significant abnormality. Spleen: Within normal limits in size and appearance. Adrenals/Urinary Tract: No masses identified. No evidence of hydronephrosis. Stomach/Bowel: Previously seen small bowel obstruction has resolved in the interval. There remains however evidence of a focal fluid collection which appears to arise from the small bowel. It is similar to that seen on the prior exam it demonstrates some persistent inflammatory change. This is again the hila suspicious for underlying inflammatory bowel disease such as Crohn's. The extra luminal air has resolved in the interval from the prior exam. These surrounding small lymph nodes are again identified and stable. Vascular/Lymphatic: No other pathologically enlarged lymph nodes. No evidence of abdominal aortic aneurysm. Reproductive: No mass or other significant abnormality. Other: None. Musculoskeletal:  No suspicious bone lesions identified. IMPRESSION: Resolution of previously seen small bowel obstruction. There remains however a a small fluid collection with associated phlegmonous changes  adjacent to the bowel loops of the mid to distal small bowel. This again raises suspicion for inflammatory bowel disease. The extra luminal air seen previously has resolved in the interval. No other focal abnormality is seen. Electronically Signed   By: Alcide Clever M.D.   On: 10/01/2015 12:44   I have personally reviewed and evaluated these images and lab results as part of my medical decision-making.  MDM   Final diagnoses:  Phlegmon  Right lower quadrant abdominal pain   Patient nontoxic-appearing, vital signs stable. Patient was sent to the ED for further imaging to rule out obstruction. CT demonstrates resolution of previously seen small bowel obstruction. There remains however a small fluid collection with  associated phlegmonous changes adjacent to the bowel loops of the mid to distal small bowel which raises suspicion for inflammatory bowel disease. Gastroenterology was consulted to follow the patient. Patient will need admission for pain control and symptomatic management. Spoke with Dr. Roda Shutters who advised med-surg bed. Patient is in understanding and agreement with the plan.   Melton Krebs, PA-C 10/01/15 1454  Raeford Razor, MD 10/02/15 [phone]

## 2015-10-01 NOTE — Consult Note (Signed)
[facility] Consult: 3:29 PM 10/01/2015     Referring Provider: abdominal pain.   Primary Care Physician:  Thora Lance, MD Primary Gastroenterologist:  Dr. Adela Lank.     Reason for Consultation:  Abdominal pain.    HPI: Luis Pineda is a 35 y.o. male. Healthy before 08/2015.  SBO during 6/9 - 09/01/15 admission.  Initial CT with thickened SB/enteritis and phlegmon in RLQ, worrisome for Crohns, ? Penetrating disease vs perf.  Followed by surgeon during admission.  Managed medically with antibiotics and IV Solumedrol.  Discharged on Prednisone taper.   09/16/15 Colonoscopy:  Normal examined TI.  Sigmoid tics.  Not able to reach area of concern. Biopsies of TI show benign small bowel.   09/23/15 MR enterography:  1. Persistent inflammatory process involving the distal ileum suspicious for Crohn's disease and probable mild or partial small bowel obstruction the with slightly dilated proximal small bowel loops and distal decompressed loops. The terminal ileum is normal. 2. Rim enhancing fluid collection adjacent to the inflamed small bowel in the pelvis, likely a walled-off fluid collection or diverticulum. Persistent inflammation in the adjacent small bowel mesentery.  Hepatitis B serologies and Quantiferon gold TB test are negative.   Referral back to surgery, Dr Romie Levee, arranged for 10/07/15.  By phone, she had stated that pt would need to wean down the steroids before any surgeries.  Pt following diet consisting mostly very soft, well-cooked or liquid.  Tried eating well chewed chicken when he first returned home and this made abdominal discomfort worse.  Prednisone dose of 25 mg over last few days.  Has had low-level RLQ pain since discharge, quite tolerable. Starting on 7/9 the pain in RLQ has  steadily increased along with onset of heartburn and "sore throat"  Pt made calls to GI c/o abdominal pain.  Bentyl added, and starting last night he has taken 2 doses of this along with Zantac.  These have not helped.  Some queasiness, no emesis.  Night sweats.  BMs have been loose and once daily but with the recent increase in pain having same type stools but smaller volume and 8 to 10 of these per day.  Never seeing blood in stools.  Today the pain is even worse and now moving into lower mid pelvis.  Pain intensifies with cough.  This all feels like it did in the days before the 6/9 admission.     Came to ED today with persistent 8/10 pain.  CT abd/pelvis: SBO resolved but persitent, stable phlegmon.   WBC 11.2.  No fever.  No use of tylenol, NSAIDs, ASA.  No ETOH, illegal drugs.  No smoking.  +25# weight loss since first onset of sxs.      Past Medical History  Diagnosis Date  . SBO (small bowel obstruction) ([facility]) 08/2015    Past Surgical History  Procedure Laterality Date  . Lasik    . Arthroscopy knee w/ drilling Right     Prior to Admission medications   Medication Sig Start Date End Date Taking? Authorizing Provider  dicyclomine (  BENTYL) 20 MG tablet Take 1 tablet (20 mg total) by mouth every 8 (eight) hours. 09/30/15  Yes Ruffin Frederick, MD  predniSONE (DELTASONE) 10 MG tablet Take 3 tabs ( 30 mg) every morning x 14 days then decrease by 5 mg weekly. Patient taking differently: Take 25 mg by mouth daily with breakfast. Take 3 tabs ( 30 mg) every morning x 14 days then decrease by 5 mg weekly. 09/15/15  Yes Amy S Esterwood, PA-C  ranitidine (ZANTAC) 150 MG capsule Take 150 mg by mouth daily as needed for heartburn.   Yes Historical Provider, MD  predniSONE (DELTASONE) 20 MG tablet Take 2 tablets (40 mg total) by mouth daily with breakfast. Patient not taking: Reported on 10/01/2015 09/01/15   [place] Cyndie Mull, MD    Scheduled Meds:  Infusions:  PRN  Meds:    Allergies as of 10/01/2015  . (No Known Allergies)    Family History  Problem Relation Age of Onset  . GI problems    . Breast cancer Mother   . Melanoma Father     Social History   Social History  . Marital Status: Married    Spouse Name: N/A  . Number of Children: 2  . Years of Education: N/A   Occupational History  . manager [employer]   Social History Main Topics  . Smoking status: Never Smoker   . Smokeless tobacco: Never Used  . Alcohol Use: Yes     Comment: OCCASIONAL  . Drug Use: No  . Sexual Activity: Not on file   Other Topics Concern  . Not on file   Social History Narrative  No herbal supplements.   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  GU:  No hematuria, no frequency GI:  Per HPI Heme:  No bleeding or bruising   Transfusions:  None ever MS:  No low back, hip or joint pains.   Neuro:  No headaches, no peripheral tingling or numbness.  No visual changes or disturbances Derm:  No itching, no rash or sores.  Endocrine:  No sweats or chills.  No polyuria or dysuria Immunization:  Not queried.  Travel:  None beyond local counties in last few months.    PHYSICAL EXAM: Vital signs in last 24 hours: Filed Vitals:   10/01/15 1240 10/01/15 1405  BP: 131/90 122/76  Pulse: 75 65  Temp:    Resp: 18 15   Wt Readings from Last 3 Encounters:  10/01/15 93.441 kg (206 lb)  09/23/15 100.245 kg (221 lb)  09/16/15 100.245 kg (221 lb)    General: pleasant, somewhat uncomfortable, does not looks ill.  Well developed, well nourished.  Muscular build Head:  No asymmetry or swelling  Eyes:  No icterus or conj pallor Ears:  Not HOH.    Nose:  No discharge Mouth:  Clear and moist.   Neck:  No mass or JVD.   Lungs:  Clear bil.  No dyspnea or cough Heart: RRR.  No MRG Abdomen:  Soft, ND.  BS quiet.  Tender right  side, waist to LQ and in mid lower abdomen/pelvis.  No guard or rebound   Rectal: deferred   Musc/Skeltl: no joint redness, deformity, swelling Extremities:  No CCE  Neurologic:  Oriented x 3.  No tremor.  No limb weakness.   Skin:  No rash,  no telangectasia or sores   Psych:  Pleasant, cooperative, not anxious.  Tatoos:  On upper arms and left upper chest:  Professional grade.   Intake/Output from previous day:   Intake/Output this shift:    LAB RESULTS:  Recent Labs  10/01/15 0901 10/01/15 1012  WBC 10.3 11.2*  HGB 14.4 14.8  HCT 42.0 44.4  PLT 219.0 223   BMET Lab Results  Component Value Date   NA 139 10/01/2015   NA 139 08/31/2015   NA 140 08/29/2015   K 3.6 10/01/2015   K 4.2 08/31/2015   K 3.7 08/29/2015   CL 104 10/01/2015   CL 108 08/31/2015   CL 106 08/29/2015   CO2 29 10/01/2015   CO2 27 08/31/2015   CO2 27 08/29/2015   GLUCOSE 106* 10/01/2015   GLUCOSE 122* 08/31/2015   GLUCOSE 92 08/29/2015   BUN 8 10/01/2015   BUN 8 08/31/2015   BUN 8 08/29/2015   CREATININE 1.06 10/01/2015   CREATININE 1.13 08/31/2015   CREATININE 1.32* 08/29/2015   CALCIUM 9.1 10/01/2015   CALCIUM 8.7* 08/31/2015   CALCIUM 8.8* 08/29/2015   LFT  Recent Labs  10/01/15 1005  PROT 7.0  ALBUMIN 4.2  AST 13*  ALT 26  ALKPHOS 56  BILITOT 1.3*   PT/INR No results found for: INR, PROTIME Hepatitis Panel No results for input(s): HEPBSAG, HCVAB, HEPAIGM, HEPBIGM in the last 72 hours. C-Diff No components found for: CDIFF Lipase     Component Value Date/Time   LIPASE 26 10/01/2015 1005    Drugs of Abuse  No results found for: LABOPIA, COCAINSCRNUR, LABBENZ, AMPHETMU, THCU, LABBARB   RADIOLOGY STUDIES: Ct Abdomen Pelvis W Contrast  10/01/2015  CLINICAL DATA:  Abdominal pain and diarrhea EXAM: CT ABDOMEN AND PELVIS WITH CONTRAST TECHNIQUE: Multidetector CT imaging of the abdomen and pelvis was performed using the standard protocol following bolus administration of  intravenous contrast. CONTRAST:  ISOVUE-300 IOPAMIDOL (ISOVUE-300) INJECTION 61% COMPARISON:  08/28/2015 FINDINGS: Lower chest:  No acute findings. Hepatobiliary: No masses or other significant abnormality. Pancreas: No mass, inflammatory changes, or other significant abnormality. Spleen: Within normal limits in size and appearance. Adrenals/Urinary Tract: No masses identified. No evidence of hydronephrosis. Stomach/Bowel: Previously seen small bowel obstruction has resolved in the interval. There remains however evidence of a focal fluid collection which appears to arise from the small bowel. It is similar to that seen on the prior exam it demonstrates some persistent inflammatory change. This is again the hila suspicious for underlying inflammatory bowel disease such as Crohn's. The extra luminal air has resolved in the interval from the prior exam. These surrounding small lymph nodes are again identified and stable. Vascular/Lymphatic: No other pathologically enlarged lymph nodes. No evidence of abdominal aortic aneurysm. Reproductive: No mass or other significant abnormality. Other: None. Musculoskeletal:  No suspicious bone lesions identified. IMPRESSION: Resolution of previously seen small bowel obstruction. There remains however a a small fluid collection with associated phlegmonous changes adjacent to the bowel loops of the mid to distal small bowel. This again raises suspicion for inflammatory bowel disease. The extra luminal air seen previously has resolved in the interval. No other focal abnormality is seen. Electronically Signed   By: Alcide Clever M.D.   On: 10/01/2015 12:44    ENDOSCOPIC STUDIES: Per HPI  IMPRESSION:   *  Progressive abdominal pain in pt strongly suspected to have SB Crohns.  Prednisone wean in progress  Bowel obstruction resolved but phlegmon persistent per today's  CT.    PLAN:     *  Needs admission.  Called surgery to see pt.  Pain mgt.  ? I V Solumedrol, ?  Antibiotics.    Jennye Moccasin  10/01/2015, 3:29 PM Pager: [phone]

## 2015-10-01 NOTE — Consult Note (Signed)
Reason for Consult:  Crohn's with reoccurring abdominal pain Referring Physician: Dr. Dorothea Glassman  Luis Pineda is an 36 y.o. male.  HPI: Pt seen at [facility] during hospitalization 08/28/15-09/11/15, with an SBO.  He has no history of any issues before this, but if you ask he has had loose stools for years, and episodes of distension and fullness after large meals for some time.     Work up by GI consistent with Crohn's disease.  He was treated with steroids and antibiotics. Antibiotics were completed and the steroids have been tapered.  He had an outpatient colonoscopy on 09/16/15 by Dr/ Armbruster.  Findings show:  The perianal and digital rectal examinations were normal.  - The terminal ileum was deeply intubated (roughly 30cm) and appeared normal. This was biopsied with a cold forceps for histology. - A few small-mouthed diverticula were found in the sigmoid colon. - The exam was otherwise without abnormality on direct and retroflexion views.   MR enterography on 09/23/15 shows:  The stomach and duodenum are unremarkable. The proximal small bowel appears normal. Mild persistent small bowel dilatation in the upper central pelvic region with a persistent area of inflammatory bowel disease with a slightly thickened enhancing bowel wall and subsequent distal decompressed small bowel loops. There is persistent inflammation in the adjacent small bowel mesentery and right small walled-off rim enhancing fluid collection or possibly a diverticulum. The terminal ileum is normal. Findings highly suspicious for Crohn's disease. He called DR. Armbruster today and having 8/10pain on clear liquids, nausea without emesis, and pain is similar to what he had on prior admission.  He was scheduled to see Dr. Marcello Moores next week for evaluation and possible resection of the constricted area of small bowel.  With his current symptoms he is being admitted for pain control and treatment of his disease.   Work up in the ED today shows:  BP  is up, he is afebrile, VSS.  Labs show elevated WBC 11.2, labs are otherwise normal.  Repeat CT today shows:  Resolution of previously seen small bowel obstruction. There remains however a a small fluid collection with associated phlegmonous changes adjacent to the bowel loops of the mid to distal small bowel. This again raises suspicion for inflammatory bowel disease. The extra luminal air seen previously has resolved in the interval.  We are ask to see now.  QUANTIFERON TB GOLD ASSAY:  Negative -  09/01/14  Colonoscopy 09/16/15:    Surgical [P], terminal ileum - BENIGN SMALL BOWEL-TYPE MUCOSA. - THERE IS NO EVIDENCE OF SIGNIFICANT INFLAMMATION, DYSPLASIA OR MALIGNANCY.Biopsy from  q qQQQQuantiferon tb gold assay  Quantiferon tb gold assay  Past Medical History  Diagnosis Date  . SBO (small bowel obstruction) ([facility]) 08/2015    Past Surgical History  Procedure Laterality Date  . Lasik    . Arthroscopy knee w/ drilling Right     Family History  Problem Relation Age of Onset  . GI problems    . Breast cancer Mother   . Melanoma Father     Social History:  reports that he has never smoked. He has never used smokeless tobacco. He reports that he drinks alcohol. He reports that he does not use illicit drugs.  Allergies: No Known Allergies  Prior to Admission medications   Medication Sig Start Date End Date Taking? Authorizing Provider  dicyclomine (BENTYL) 20 MG tablet Take 1 tablet (20 mg total) by mouth every 8 (eight) hours. 09/30/15  Yes Manus Gunning, MD  predniSONE (DELTASONE) 10 MG tablet Take 3 tabs ( 30 mg) every morning x 14 days then decrease by 5 mg weekly. Patient taking differently: Take 25 mg by mouth daily with breakfast. Take 3 tabs ( 30 mg) every morning x 14 days then decrease by 5 mg weekly. 09/15/15  Yes Amy S Esterwood, PA-C    Currently he is on 25 mg per day.  ranitidine (ZANTAC) 150 MG capsule Take 150 mg by mouth daily as needed for heartburn.   Yes  Historical Provider, MD     Results for orders placed or performed during the hospital encounter of 10/01/15 (from the past 48 hour(s))  Urinalysis, Routine w reflex microscopic     Status: None   Collection Time: 10/01/15 10:00 AM  Result Value Ref Range   Color, Urine YELLOW YELLOW   APPearance CLEAR CLEAR   Specific Gravity, Urine 1.020 1.005 - 1.030   pH 6.0 5.0 - 8.0   Glucose, UA NEGATIVE NEGATIVE mg/dL   Hgb urine dipstick NEGATIVE NEGATIVE   Bilirubin Urine NEGATIVE NEGATIVE   Ketones, ur NEGATIVE NEGATIVE mg/dL   Protein, ur NEGATIVE NEGATIVE mg/dL   Nitrite NEGATIVE NEGATIVE   Leukocytes, UA NEGATIVE NEGATIVE    Comment: MICROSCOPIC NOT DONE ON URINES WITH NEGATIVE PROTEIN, BLOOD, LEUKOCYTES, NITRITE, OR GLUCOSE <1000 mg/dL.  Lipase, blood     Status: None   Collection Time: 10/01/15 10:05 AM  Result Value Ref Range   Lipase 26 11 - 51 U/L  Comprehensive metabolic panel     Status: Abnormal   Collection Time: 10/01/15 10:05 AM  Result Value Ref Range   Sodium 139 135 - 145 mmol/L   Potassium 3.6 3.5 - 5.1 mmol/L   Chloride 104 101 - 111 mmol/L   CO2 29 22 - 32 mmol/L   Glucose, Bld 106 (H) 65 - 99 mg/dL   BUN 8 6 - 20 mg/dL   Creatinine, Ser 1.06 0.61 - 1.24 mg/dL   Calcium 9.1 8.9 - 10.3 mg/dL   Total Protein 7.0 6.5 - 8.1 g/dL   Albumin 4.2 3.5 - 5.0 g/dL   AST 13 (L) 15 - 41 U/L   ALT 26 17 - 63 U/L   Alkaline Phosphatase 56 38 - 126 U/L   Total Bilirubin 1.3 (H) 0.3 - 1.2 mg/dL   GFR calc non Af Amer >60 >60 mL/min   GFR calc Af Amer >60 >60 mL/min    Comment: (NOTE) The eGFR has been calculated using the CKD EPI equation. This calculation has not been validated in all clinical situations. eGFR's persistently <60 mL/min signify possible Chronic Kidney Disease.    Anion gap 6 5 - 15  CBC     Status: Abnormal   Collection Time: 10/01/15 10:12 AM  Result Value Ref Range   WBC 11.2 (H) 4.0 - 10.5 K/uL   RBC 5.01 4.22 - 5.81 MIL/uL   Hemoglobin 14.8  13.0 - 17.0 g/dL   HCT 44.4 39.0 - 52.0 %   MCV 88.6 78.0 - 100.0 fL   MCH 29.5 26.0 - 34.0 pg   MCHC 33.3 30.0 - 36.0 g/dL   RDW 13.0 11.5 - 15.5 %   Platelets 223 150 - 400 K/uL    Ct Abdomen Pelvis W Contrast  10/01/2015  CLINICAL DATA:  Abdominal pain and diarrhea EXAM: CT ABDOMEN AND PELVIS WITH CONTRAST TECHNIQUE: Multidetector CT imaging of the abdomen and pelvis was performed using the standard protocol following bolus administration of intravenous contrast.  CONTRAST:  120m ISOVUE-300 IOPAMIDOL (ISOVUE-300) INJECTION 61% COMPARISON:  08/28/2015 FINDINGS: Lower chest:  No acute findings. Hepatobiliary: No masses or other significant abnormality. Pancreas: No mass, inflammatory changes, or other significant abnormality. Spleen: Within normal limits in size and appearance. Adrenals/Urinary Tract: No masses identified. No evidence of hydronephrosis. Stomach/Bowel: Previously seen small bowel obstruction has resolved in the interval. There remains however evidence of a focal fluid collection which appears to arise from the small bowel. It is similar to that seen on the prior exam it demonstrates some persistent inflammatory change. This is again the hila suspicious for underlying inflammatory bowel disease such as Crohn's. The extra luminal air has resolved in the interval from the prior exam. These surrounding small lymph nodes are again identified and stable. Vascular/Lymphatic: No other pathologically enlarged lymph nodes. No evidence of abdominal aortic aneurysm. Reproductive: No mass or other significant abnormality. Other: None. Musculoskeletal:  No suspicious bone lesions identified. IMPRESSION: Resolution of previously seen small bowel obstruction. There remains however a a small fluid collection with associated phlegmonous changes adjacent to the bowel loops of the mid to distal small bowel. This again raises suspicion for inflammatory bowel disease. The extra luminal air seen previously has  resolved in the interval. No other focal abnormality is seen. Electronically Signed   By: MInez CatalinaM.D.   On: 10/01/2015 12:44    Review of Systems  Constitutional: Positive for weight loss (25 pounds since June 2017 when he was hospitalized). Negative for fever and chills.       Sweats  HENT: Negative.   Eyes: Negative.   Respiratory: Negative.   Cardiovascular: Negative.   Gastrointestinal: Positive for nausea, abdominal pain and diarrhea (He has had loose stools for years, many episodes of distension with high fiber and large meat bolus). Negative for vomiting, constipation, blood in stool and melena.  Genitourinary: Negative.   Musculoskeletal: Negative.   Skin: Negative.   Neurological: Negative.   Endo/Heme/Allergies: Negative.   Psychiatric/Behavioral: Negative.    Blood pressure 122/76, pulse 65, temperature 97.7 F (36.5 C), temperature source Oral, resp. rate 15, height '5\' 10"'  (1.778 m), weight 93.441 kg (206 lb), SpO2 96 %. Physical Exam  Constitutional: He is oriented to person, place, and time. He appears well-developed and well-nourished. No distress.  HENT:  Head: Normocephalic and atraumatic.  Nose: Nose normal.  Eyes: Right eye exhibits no discharge. Left eye exhibits no discharge. No scleral icterus.  Neck: Normal range of motion. Neck supple. No JVD present. No tracheal deviation present. No thyromegaly present.  Cardiovascular: Normal rate, regular rhythm, normal heart sounds and intact distal pulses.   No murmur heard. Respiratory: No respiratory distress. He has no wheezes. He has no rales. He exhibits no tenderness.  GI: Soft. Bowel sounds are normal. He exhibits no distension and no mass. There is tenderness. There is rebound. There is no guarding.  Musculoskeletal: He exhibits no edema or tenderness.  Lymphadenopathy:    He has no cervical adenopathy.  Neurological: He is alert and oriented to person, place, and time. No cranial nerve deficit.  Skin:  Skin is warm and dry. No rash noted. He is not diaphoretic. No erythema. No pallor.  Psychiatric: He has a normal mood and affect. His behavior is normal. Judgment and thought content normal.    Assessment/Plan: Prior SBO with presumed diagnosis of Crohn's.  Now on medical taper of steroids with recurrent pain and nausea. Weight loss - 25 pounds last 6 weeks.  Plan:  He  was able to be seen by both Dr.Ingram and Dr. Loletha Carrow at the same time.  Long discussion on options were reviewed by both physicians with the patient.  We are currently strongly suspicious that this is a Crohn's process.  Unfortunately the only way to make this diagnosis is with a tissue sample we currently do not have. Medical management today would involve high dose steroids with a slower taper and transition to a Biologic type medication.  Surgery would involve exploratory laparoscopy, possible laparotomy, resection of the affect section that is about 7 cm; reanastomosis of the unaffected small bowel, recovery from the surgery, risk of surgery on low dose steroids.   Then; after the diagnosis is certain, ongoing medical management.    After a long discussion it was Dr. Darrel Hoover opinion surgery was a very good option, but he also believes medical management is also a reasonable option.  Medicine is going to admit him tonight, NPO except for ice chips, NPO after MN.  We will let him and his wife discuss this and have further discussion in the AM.  If the patient wishes to go forward with surgery we can plan to do that tomorrow.  Azell Bill 10/01/2015, 3:11 PM

## 2015-10-01 NOTE — ED Notes (Signed)
Pt reports he started having abd pain 6 weeks ago. Was seen at [facility] for this at that time, had an outpatient CT done which showed SBO. Was admitted to hospital for 5 days at that point. After discharge was referred to gastroenterologist who performed colonscopy and MRI. Pt possibly had Chrons and has constriction in small intestine. Was recommended pt have surgery to remove constriction, but cannot see surgery until next week. Since Monday pt has been having similar symptoms to 6 weeks ago. Was given medication by gastro for this but it is not helping. Is currently on prednisone, and has completed abx.

## 2015-10-01 NOTE — ED Notes (Signed)
Patient transported to CT 

## 2015-10-01 NOTE — H&P (Signed)
History and Physical  Luis Pineda [id number] DOB: 10-16-1980 DOA: 10/01/2015  Referring physician: EDP PCP: Thora Lance, MD   Chief Complaint: abdominal pain  HPI: Luis Pineda is a 35 y.o. male   With h/o sbo thought due to inflammatory bowel disease has been on steroids, however, his symptom persisted,he presented to [facility] ED a CT abdomen is done, showed resolution of previously seen sbo, but persistent changes in teh mid and distal small bowel which is concerning for IBD, he was given prn pain meds, gi consulted, hospitalist called to admit the patient. Currently he denies fever, some nausea, no vomiting , no blood in stool, he has been on liquid diet per gi instruction.   Review of Systems:  Detail per HPI, Review of systems are otherwise negative  Past Medical History  Diagnosis Date  . SBO (small bowel obstruction) (HCC) 08/2015    suspected Crohn's disease.    Past Surgical History  Procedure Laterality Date  . Lasik    . Arthroscopy knee w/ drilling Right   . Colonoscopy  09/16/15    normal into terminal ileum.  TI biopsies: normal SB.     Social History:  reports that he has never smoked. He has never used smokeless tobacco. He reports that he drinks alcohol. He reports that he does not use illicit drugs. Patient lives at home & is able to participate in activities of daily living independently   No Known Allergies  Family History  Problem Relation Age of Onset  . GI problems    . Breast cancer Mother   . Melanoma Father       Prior to Admission medications   Medication Sig Start Date End Date Taking? Authorizing Provider  dicyclomine (BENTYL) 20 MG tablet Take 1 tablet (20 mg total) by mouth every 8 (eight) hours. 09/30/15  Yes Ruffin Frederick, MD  predniSONE (DELTASONE) 10 MG tablet Take 3 tabs ( 30 mg) every morning x 14 days then decrease by 5 mg weekly. Patient taking differently: Take 25 mg by mouth daily with breakfast. Take 3 tabs ( 30 mg)  every morning x 14 days then decrease by 5 mg weekly. 09/15/15  Yes Amy S Esterwood, PA-C  ranitidine (ZANTAC) 150 MG capsule Take 150 mg by mouth daily as needed for heartburn.   Yes Historical Provider, MD  predniSONE (DELTASONE) 20 MG tablet Take 2 tablets (40 mg total) by mouth daily with breakfast. Patient not taking: Reported on 10/01/2015 09/01/15   Clanford Cyndie Mull, MD    Physical Exam: BP 122/76 mmHg  Pulse 65  Temp(Src) 97.7 F (36.5 C) (Oral)  Resp 15  Ht  (1.778 m)  Wt 93.441 kg (206 lb)  BMI 29.56 kg/m2  SpO2 96%  General:  NAD Eyes: PERRL ENT: unremarkable Neck: supple, no JVD Cardiovascular: RRR Respiratory: CTABL Abdomen: soft/ND, tender lower abdomen, positive bowel sounds Skin: no rash Musculoskeletal:  No edema Psychiatric: calm/cooperative Neurologic: no focal findings            Labs on Admission:  Basic Metabolic Panel:  Recent Labs Lab 10/01/15 1005  NA 139  K 3.6  CL 104  CO2 29  GLUCOSE 106*  BUN 8  CREATININE 1.06  CALCIUM 9.1   Liver Function Tests:  Recent Labs Lab 10/01/15 1005  AST 13*  ALT 26  ALKPHOS 56  BILITOT 1.3*  PROT 7.0  ALBUMIN 4.2    Recent Labs Lab 10/01/15 1005  LIPASE 26  No results for input(s): AMMONIA in the last 168 hours. CBC:  Recent Labs Lab 10/01/15 0901 10/01/15 1012  WBC 10.3 11.2*  NEUTROABS 7.4  --   HGB 14.4 14.8  HCT 42.0 44.4  MCV 85.9 88.6  PLT 219.0 223   Cardiac Enzymes: No results for input(s): CKTOTAL, CKMB, CKMBINDEX, TROPONINI in the last 168 hours.  BNP (last 3 results) No results for input(s): BNP in the last 8760 hours.  ProBNP (last 3 results) No results for input(s): PROBNP in the last 8760 hours.  CBG: No results for input(s): GLUCAP in the last 168 hours.  Radiological Exams on Admission: Ct Abdomen Pelvis W Contrast  10/01/2015  CLINICAL DATA:  Abdominal pain and diarrhea EXAM: CT ABDOMEN AND PELVIS WITH CONTRAST TECHNIQUE: Multidetector CT  imaging of the abdomen and pelvis was performed using the standard protocol following bolus administration of intravenous contrast. CONTRAST:  ISOVUE-300 IOPAMIDOL (ISOVUE-300) INJECTION 61% COMPARISON:  08/28/2015 FINDINGS: Lower chest:  No acute findings. Hepatobiliary: No masses or other significant abnormality. Pancreas: No mass, inflammatory changes, or other significant abnormality. Spleen: Within normal limits in size and appearance. Adrenals/Urinary Tract: No masses identified. No evidence of hydronephrosis. Stomach/Bowel: Previously seen small bowel obstruction has resolved in the interval. There remains however evidence of a focal fluid collection which appears to arise from the small bowel. It is similar to that seen on the prior exam it demonstrates some persistent inflammatory change. This is again the hila suspicious for underlying inflammatory bowel disease such as Crohn's. The extra luminal air has resolved in the interval from the prior exam. These surrounding small lymph nodes are again identified and stable. Vascular/Lymphatic: No other pathologically enlarged lymph nodes. No evidence of abdominal aortic aneurysm. Reproductive: No mass or other significant abnormality. Other: None. Musculoskeletal:  No suspicious bone lesions identified. IMPRESSION: Resolution of previously seen small bowel obstruction. There remains however a a small fluid collection with associated phlegmonous changes adjacent to the bowel loops of the mid to distal small bowel. This again raises suspicion for inflammatory bowel disease. The extra luminal air seen previously has resolved in the interval. No other focal abnormality is seen. Electronically Signed   By: Alcide Clever M.D.   On: 10/01/2015 12:44     Assessment/Plan Present on Admission:  . Abdominal pain  Abdominal pain , likely from IBD, currently patient is npo, on ivf, prn pain meds, iv steroids, GI/general surgery consulted Likely OR in  am.   DVT prophylaxis: scd  Consultants: GI/general surgery  Code Status: full   Family Communication:  Patient and his wife in room  Disposition Plan: admit to med surg  Time spent:  Sakiyah Shur MD, PhD [facility] Pager [phone] If 7PM-7AM, please contact night-coverage at [email or web address], password [facility]

## 2015-10-02 ENCOUNTER — Encounter (HOSPITAL_COMMUNITY): Payer: Self-pay | Admitting: Certified Registered Nurse Anesthetist

## 2015-10-02 ENCOUNTER — Inpatient Hospital Stay (HOSPITAL_COMMUNITY): Payer: 59 | Admitting: Certified Registered Nurse Anesthetist

## 2015-10-02 ENCOUNTER — Encounter (HOSPITAL_COMMUNITY)
Admission: EM | Disposition: A | Discharge status: Home / Self Care | Payer: Self-pay | Source: Home / Self Care | Attending: Internal Medicine

## 2015-10-02 DIAGNOSIS — K5669 Other intestinal obstruction: Secondary | ICD-10-CM

## 2015-10-02 HISTORY — PX: LAPAROSCOPIC SMALL BOWEL RESECTION: SHX5929

## 2015-10-02 LAB — BASIC METABOLIC PANEL
ANION GAP: 8 (ref 5–15)
BUN: 9 mg/dL (ref 6–20)
CHLORIDE: 104 mmol/L (ref 101–111)
CO2: 26 mmol/L (ref 22–32)
CREATININE: 0.91 mg/dL (ref 0.61–1.24)
Calcium: 9.2 mg/dL (ref 8.9–10.3)
GFR calc non Af Amer: >60 mL/min (ref >60)
Glucose, Bld: 137 mg/dL — ABNORMAL HIGH (ref 65–99)
POTASSIUM: 4.4 mmol/L (ref 3.5–5.1)
SODIUM: 138 mmol/L (ref 135–145)

## 2015-10-02 LAB — CBC
HCT: 43 % (ref 39.0–52.0)
Hemoglobin: 14.6 g/dL (ref 13.0–17.0)
MCH: 29.4 pg (ref 26.0–34.0)
MCHC: 34 g/dL (ref 30.0–36.0)
MCV: 86.7 fL (ref 78.0–100.0)
Platelets: 204 10*3/uL (ref 150–400)
RBC: 4.96 MIL/uL (ref 4.22–5.81)
RDW: 13 % (ref 11.5–15.5)
WBC: 10.1 10*3/uL (ref 4.0–10.5)

## 2015-10-02 LAB — ABO/RH: ABO/RH(D): O NEG

## 2015-10-02 LAB — TYPE AND SCREEN
ABO/RH(D): O NEG
ANTIBODY SCREEN: NEGATIVE

## 2015-10-02 LAB — PROTIME-INR
INR: 1.16 (ref 0.00–1.49)
Prothrombin Time: 14.5 seconds (ref 11.6–15.2)

## 2015-10-02 LAB — MAGNESIUM: Magnesium: 2 mg/dL (ref 1.7–2.4)

## 2015-10-02 SURGERY — EXCISION, SMALL INTESTINE, LAPAROSCOPIC
Anesthesia: General | Site: Abdomen

## 2015-10-02 MED ORDER — BUPIVACAINE-EPINEPHRINE 0.5% -1:200000 IJ SOLN
INTRAMUSCULAR | Status: AC
  Filled 2015-10-02: qty 1

## 2015-10-02 MED ORDER — SUGAMMADEX SODIUM 200 MG/2ML IV SOLN
INTRAVENOUS | Status: DC | PRN
  Administered 2015-10-02: 200 mg via INTRAVENOUS

## 2015-10-02 MED ORDER — DEXTROSE 5 % IV SOLN
2.0000 g | Freq: Two times a day (BID) | INTRAVENOUS | Status: AC
  Administered 2015-10-02: 2 g via INTRAVENOUS
  Filled 2015-10-02: qty 2

## 2015-10-02 MED ORDER — HYDROMORPHONE HCL 2 MG/ML IJ SOLN
INTRAMUSCULAR | Status: AC
Start: 2015-10-02 — End: 2015-10-02
  Filled 2015-10-02: qty 1

## 2015-10-02 MED ORDER — METHOCARBAMOL 500 MG PO TABS
500.0000 mg | ORAL_TABLET | Freq: Four times a day (QID) | ORAL | Status: DC | PRN
  Administered 2015-10-03 – 2015-10-04 (×4): 500 mg via ORAL
  Filled 2015-10-02 (×4): qty 1

## 2015-10-02 MED ORDER — SODIUM CHLORIDE 0.9 % IJ SOLN
INTRAMUSCULAR | Status: AC
  Filled 2015-10-02: qty 10

## 2015-10-02 MED ORDER — SUGAMMADEX SODIUM 200 MG/2ML IV SOLN
INTRAVENOUS | Status: AC
  Filled 2015-10-02: qty 2

## 2015-10-02 MED ORDER — GLYCOPYRROLATE 0.2 MG/ML IJ SOLN
INTRAMUSCULAR | Status: AC
  Filled 2015-10-02: qty 2

## 2015-10-02 MED ORDER — SUCCINYLCHOLINE CHLORIDE 20 MG/ML IJ SOLN
INTRAMUSCULAR | Status: DC | PRN
  Administered 2015-10-02: 100 mg via INTRAVENOUS

## 2015-10-02 MED ORDER — PROMETHAZINE HCL 25 MG/ML IJ SOLN: 6.2500 mg | INTRAMUSCULAR | Status: DC | PRN

## 2015-10-02 MED ORDER — ROCURONIUM BROMIDE 100 MG/10ML IV SOLN
INTRAVENOUS | Status: DC | PRN
  Administered 2015-10-02 (×4): 10 mg via INTRAVENOUS
  Administered 2015-10-02: 50 mg via INTRAVENOUS

## 2015-10-02 MED ORDER — PROPOFOL 10 MG/ML IV BOLUS
INTRAVENOUS | Status: AC
  Filled 2015-10-02: qty 20

## 2015-10-02 MED ORDER — ROCURONIUM BROMIDE 100 MG/10ML IV SOLN
INTRAVENOUS | Status: AC
  Filled 2015-10-02: qty 1

## 2015-10-02 MED ORDER — ONDANSETRON 4 MG PO TBDP: 4.0000 mg | ORAL_TABLET | Freq: Four times a day (QID) | ORAL | Status: DC | PRN

## 2015-10-02 MED ORDER — ATROPINE SULFATE 0.4 MG/ML IJ SOLN
INTRAMUSCULAR | Status: AC
  Filled 2015-10-02: qty 1

## 2015-10-02 MED ORDER — LIDOCAINE HCL (CARDIAC) 20 MG/ML IV SOLN
INTRAVENOUS | Status: AC
  Filled 2015-10-02: qty 5

## 2015-10-02 MED ORDER — MEPERIDINE HCL 50 MG/ML IJ SOLN
INTRAMUSCULAR | Status: AC
  Filled 2015-10-02: qty 1

## 2015-10-02 MED ORDER — HYDROMORPHONE HCL 1 MG/ML IJ SOLN
INTRAMUSCULAR | Status: DC | PRN
  Administered 2015-10-02 (×2): 1 mg via INTRAVENOUS

## 2015-10-02 MED ORDER — 0.9 % SODIUM CHLORIDE (POUR BTL) OPTIME
TOPICAL | Status: DC | PRN
Start: 2015-10-02 — End: 2015-10-02
  Administered 2015-10-02: 5000 mL

## 2015-10-02 MED ORDER — DEXTROSE 5 % IV SOLN
2.0000 g | INTRAVENOUS | Status: AC
  Administered 2015-10-02: 2 g via INTRAVENOUS

## 2015-10-02 MED ORDER — HYDROMORPHONE HCL 1 MG/ML IJ SOLN
INTRAMUSCULAR | Status: AC
  Filled 2015-10-02: qty 1

## 2015-10-02 MED ORDER — PHENYLEPHRINE HCL 10 MG/ML IJ SOLN: INTRAMUSCULAR | Status: DC | PRN

## 2015-10-02 MED ORDER — HYDROCORTISONE NA SUCCINATE PF 100 MG IJ SOLR
50.0000 mg | INTRAMUSCULAR | Status: DC
  Administered 2015-10-02 – 2015-10-04 (×3): 50 mg via INTRAVENOUS
  Filled 2015-10-02 (×3): qty 1

## 2015-10-02 MED ORDER — LACTATED RINGERS IV SOLN
INTRAVENOUS | Status: DC | PRN
  Administered 2015-10-02 (×3): via INTRAVENOUS

## 2015-10-02 MED ORDER — PROPOFOL 10 MG/ML IV BOLUS
INTRAVENOUS | Status: DC | PRN
  Administered 2015-10-02: 200 mg via INTRAVENOUS

## 2015-10-02 MED ORDER — ACETAMINOPHEN 10 MG/ML IV SOLN
INTRAVENOUS | Status: AC
  Filled 2015-10-02: qty 100

## 2015-10-02 MED ORDER — LIDOCAINE HCL (CARDIAC) 20 MG/ML IV SOLN
INTRAVENOUS | Status: DC | PRN
  Administered 2015-10-02: 100 mg via INTRAVENOUS

## 2015-10-02 MED ORDER — HYDROMORPHONE HCL 1 MG/ML IJ SOLN
0.2500 mg | INTRAMUSCULAR | Status: DC | PRN
  Administered 2015-10-02 (×4): 0.5 mg via INTRAVENOUS

## 2015-10-02 MED ORDER — FAMOTIDINE IN NACL 20-0.9 MG/50ML-% IV SOLN
20.0000 mg | Freq: Two times a day (BID) | INTRAVENOUS | Status: DC
  Administered 2015-10-02 – 2015-10-06 (×8): 20 mg via INTRAVENOUS
  Filled 2015-10-02 (×9): qty 50

## 2015-10-02 MED ORDER — PHENYLEPHRINE 40 MCG/ML (10ML) SYRINGE FOR IV PUSH (FOR BLOOD PRESSURE SUPPORT)
PREFILLED_SYRINGE | INTRAVENOUS | Status: AC
  Filled 2015-10-02: qty 10

## 2015-10-02 MED ORDER — DEXAMETHASONE SODIUM PHOSPHATE 10 MG/ML IJ SOLN
INTRAMUSCULAR | Status: DC | PRN
  Administered 2015-10-02: 10 mg via INTRAVENOUS

## 2015-10-02 MED ORDER — ONDANSETRON HCL 4 MG/2ML IJ SOLN
INTRAMUSCULAR | Status: AC
  Filled 2015-10-02: qty 2

## 2015-10-02 MED ORDER — HYDROMORPHONE HCL 1 MG/ML IJ SOLN
1.0000 mg | INTRAMUSCULAR | Status: DC | PRN
  Administered 2015-10-02 – 2015-10-06 (×24): 1 mg via INTRAVENOUS
  Filled 2015-10-02 (×24): qty 1

## 2015-10-02 MED ORDER — MIDAZOLAM HCL 5 MG/5ML IJ SOLN
INTRAMUSCULAR | Status: DC | PRN
  Administered 2015-10-02: 2 mg via INTRAVENOUS

## 2015-10-02 MED ORDER — FENTANYL CITRATE (PF) 100 MCG/2ML IJ SOLN
INTRAMUSCULAR | Status: AC
  Filled 2015-10-02: qty 2

## 2015-10-02 MED ORDER — KETOROLAC TROMETHAMINE 30 MG/ML IJ SOLN: 30.0000 mg | Freq: Once | INTRAMUSCULAR | Status: DC

## 2015-10-02 MED ORDER — FENTANYL CITRATE (PF) 100 MCG/2ML IJ SOLN
INTRAMUSCULAR | Status: DC | PRN
  Administered 2015-10-02 (×5): 50 ug via INTRAVENOUS
  Administered 2015-10-02: 100 ug via INTRAVENOUS

## 2015-10-02 MED ORDER — GLYCOPYRROLATE 0.2 MG/ML IJ SOLN
INTRAMUSCULAR | Status: DC | PRN
  Administered 2015-10-02: 0.4 mg via INTRAVENOUS

## 2015-10-02 MED ORDER — EPHEDRINE SULFATE 50 MG/ML IJ SOLN
INTRAMUSCULAR | Status: AC
  Filled 2015-10-02: qty 1

## 2015-10-02 MED ORDER — MIDAZOLAM HCL 2 MG/2ML IJ SOLN
INTRAMUSCULAR | Status: AC
  Filled 2015-10-02: qty 2

## 2015-10-02 MED ORDER — BUPIVACAINE-EPINEPHRINE 0.5% -1:200000 IJ SOLN
INTRAMUSCULAR | Status: DC | PRN
  Administered 2015-10-02: 14 mL

## 2015-10-02 MED ORDER — CHLORHEXIDINE GLUCONATE CLOTH 2 % EX PADS: 6.0000 | MEDICATED_PAD | Freq: Once | CUTANEOUS | Status: DC

## 2015-10-02 MED ORDER — EPHEDRINE SULFATE 50 MG/ML IJ SOLN
INTRAMUSCULAR | Status: DC | PRN
  Administered 2015-10-02 (×2): 5 mg via INTRAVENOUS

## 2015-10-02 MED ORDER — ONDANSETRON HCL 4 MG/2ML IJ SOLN
4.0000 mg | Freq: Four times a day (QID) | INTRAMUSCULAR | Status: DC | PRN
  Administered 2015-10-02 – 2015-10-05 (×2): 4 mg via INTRAVENOUS
  Filled 2015-10-02 (×2): qty 2

## 2015-10-02 MED ORDER — ACETAMINOPHEN 10 MG/ML IV SOLN
INTRAVENOUS | Status: DC | PRN
  Administered 2015-10-02: 1000 mg via INTRAVENOUS

## 2015-10-02 MED ORDER — HYDROCODONE-ACETAMINOPHEN 5-325 MG PO TABS
1.0000 | ORAL_TABLET | ORAL | Status: DC | PRN
  Administered 2015-10-03: 2 via ORAL
  Administered 2015-10-03: 1 via ORAL
  Administered 2015-10-04 – 2015-10-07 (×14): 2 via ORAL
  Filled 2015-10-02: qty 1
  Filled 2015-10-02 (×16): qty 2

## 2015-10-02 MED ORDER — ALVIMOPAN 12 MG PO CAPS
12.0000 mg | ORAL_CAPSULE | ORAL | Status: AC
  Administered 2015-10-02: 12 mg via ORAL
  Filled 2015-10-02: qty 1

## 2015-10-02 MED ORDER — ONDANSETRON HCL 4 MG/2ML IJ SOLN
INTRAMUSCULAR | Status: DC | PRN
  Administered 2015-10-02: 4 mg via INTRAVENOUS

## 2015-10-02 MED ORDER — FENTANYL CITRATE (PF) 250 MCG/5ML IJ SOLN
INTRAMUSCULAR | Status: AC
  Filled 2015-10-02: qty 5

## 2015-10-02 MED ORDER — POTASSIUM CHLORIDE 2 MEQ/ML IV SOLN
INTRAVENOUS | Status: DC
  Administered 2015-10-02 – 2015-10-05 (×3): via INTRAVENOUS
  Filled 2015-10-02 (×11): qty 1000

## 2015-10-02 MED ORDER — MEPERIDINE HCL 50 MG/ML IJ SOLN
6.2500 mg | INTRAMUSCULAR | Status: DC | PRN
  Administered 2015-10-02: 12.5 mg via INTRAVENOUS

## 2015-10-02 MED ORDER — ENOXAPARIN SODIUM 40 MG/0.4ML ~~LOC~~ SOLN
40.0000 mg | SUBCUTANEOUS | Status: DC
  Administered 2015-10-03 – 2015-10-06 (×4): 40 mg via SUBCUTANEOUS
  Filled 2015-10-02 (×5): qty 0.4

## 2015-10-02 MED ORDER — CEFOTETAN DISODIUM-DEXTROSE 2-2.08 GM-% IV SOLR
INTRAVENOUS | Status: AC
  Filled 2015-10-02: qty 50

## 2015-10-02 MED ORDER — ALVIMOPAN 12 MG PO CAPS
12.0000 mg | ORAL_CAPSULE | Freq: Two times a day (BID) | ORAL | Status: DC
  Administered 2015-10-03 – 2015-10-04 (×4): 12 mg via ORAL
  Filled 2015-10-02 (×5): qty 1

## 2015-10-02 SURGICAL SUPPLY — 75 items
APPLIER CLIP 5 13 M/L LIGAMAX5 (MISCELLANEOUS)
APPLIER CLIP ROT 10 11.4 M/L (STAPLE)
APR CLP MED LRG 11.4X10 (STAPLE)
APR CLP MED LRG 5 ANG JAW (MISCELLANEOUS)
BLADE EXTENDED COATED 6.5IN (ELECTRODE) ×2 IMPLANT
BLADE HEX COATED 2.75 (ELECTRODE) ×4 IMPLANT
BLADE SURG SZ10 CARB STEEL (BLADE) ×1 IMPLANT
CABLE HIGH FREQUENCY MONO STRZ (ELECTRODE) ×3 IMPLANT
CELLS DAT CNTRL 66122 CELL SVR (MISCELLANEOUS) ×1 IMPLANT
CHLORAPREP W/TINT 26ML (MISCELLANEOUS) ×3 IMPLANT
CLIP APPLIE 5 13 M/L LIGAMAX5 (MISCELLANEOUS) IMPLANT
CLIP APPLIE ROT 10 11.4 M/L (STAPLE) IMPLANT
COVER MAYO STAND STRL (DRAPES) ×5 IMPLANT
COVER SURGICAL LIGHT HANDLE (MISCELLANEOUS) ×3 IMPLANT
DECANTER SPIKE VIAL GLASS SM (MISCELLANEOUS) ×1 IMPLANT
DRAIN CHANNEL 19F RND (DRAIN) IMPLANT
DRAPE LAPAROSCOPIC ABDOMINAL (DRAPES) ×1 IMPLANT
DRAPE WARM FLUID 44X44 (DRAPE) ×3 IMPLANT
DRSG OPSITE POSTOP 4X10 (GAUZE/BANDAGES/DRESSINGS) IMPLANT
DRSG OPSITE POSTOP 4X6 (GAUZE/BANDAGES/DRESSINGS) IMPLANT
DRSG OPSITE POSTOP 4X8 (GAUZE/BANDAGES/DRESSINGS) ×2 IMPLANT
DRSG TEGADERM 2-3/8X2-3/4 SM (GAUZE/BANDAGES/DRESSINGS) ×2 IMPLANT
DRSG TELFA 3X8 NADH (GAUZE/BANDAGES/DRESSINGS) ×3 IMPLANT
ELECT PENCIL ROCKER SW 15FT (MISCELLANEOUS) ×3 IMPLANT
ELECT REM PT RETURN 9FT ADLT (ELECTROSURGICAL) ×3
ELECTRODE REM PT RTRN 9FT ADLT (ELECTROSURGICAL) ×1 IMPLANT
EVACUATOR SILICONE 100CC (DRAIN) IMPLANT
GAUZE SPONGE 2X2 8PLY STRL LF (GAUZE/BANDAGES/DRESSINGS) IMPLANT
GAUZE SPONGE 4X4 12PLY STRL (GAUZE/BANDAGES/DRESSINGS) ×1 IMPLANT
GLOVE EUDERMIC 7 POWDERFREE (GLOVE) ×6 IMPLANT
GOWN STRL REUS W/TWL XL LVL3 (GOWN DISPOSABLE) ×8 IMPLANT
HANDLE SUCTION POOLE (INSTRUMENTS) ×1 IMPLANT
KIT BASIN OR (CUSTOM PROCEDURE TRAY) ×3 IMPLANT
LIGASURE IMPACT 36 18CM CVD LR (INSTRUMENTS) ×2 IMPLANT
PAD DRESSING TELFA 3X8 NADH (GAUZE/BANDAGES/DRESSINGS) IMPLANT
PAD POSITIONING PINK XL (MISCELLANEOUS) ×4 IMPLANT
POSITIONER SURGICAL ARM (MISCELLANEOUS) ×2 IMPLANT
RELOAD PROXIMATE 75MM BLUE (ENDOMECHANICALS) ×6 IMPLANT
RELOAD STAPLE 75 3.8 BLU REG (ENDOMECHANICALS) IMPLANT
RETRACTOR WND ALEXIS 18 MED (MISCELLANEOUS) IMPLANT
RTRCTR WOUND ALEXIS 18CM MED (MISCELLANEOUS) ×3
SCISSORS LAP 5X35 DISP (ENDOMECHANICALS) ×3 IMPLANT
SET IRRIG TUBING LAPAROSCOPIC (IRRIGATION / IRRIGATOR) ×3 IMPLANT
SLEEVE XCEL OPT CAN 5 100 (ENDOMECHANICALS) ×6 IMPLANT
SPONGE GAUZE 2X2 STER 10/PKG (GAUZE/BANDAGES/DRESSINGS) ×2
SPONGE LAP 18X18 X RAY DECT (DISPOSABLE) ×7 IMPLANT
STAPLER GUN LINEAR PROX 60 (STAPLE) ×2 IMPLANT
STAPLER PROXIMATE 75MM BLUE (STAPLE) ×2 IMPLANT
STAPLER VISISTAT 35W (STAPLE) ×3 IMPLANT
SUCTION POOLE HANDLE (INSTRUMENTS) ×6
SUT ETHILON 3 0 PS 1 (SUTURE) IMPLANT
SUT MNCRL AB 4-0 PS2 18 (SUTURE) IMPLANT
SUT NOVA 1 T20/GS 25DT (SUTURE) ×2 IMPLANT
SUT PDS AB 1 CTX 36 (SUTURE) IMPLANT
SUT PDS AB 1 TP1 96 (SUTURE) ×2 IMPLANT
SUT SILK 2 0 (SUTURE) ×3
SUT SILK 2 0 SH CR/8 (SUTURE) ×5 IMPLANT
SUT SILK 2-0 18XBRD TIE 12 (SUTURE) ×1 IMPLANT
SUT SILK 3 0 (SUTURE) ×3
SUT SILK 3 0 SH CR/8 (SUTURE) ×3 IMPLANT
SUT SILK 3-0 18XBRD TIE 12 (SUTURE) ×1 IMPLANT
SYR BULB IRRIGATION 50ML (SYRINGE) ×3 IMPLANT
SYS LAPSCP GELPORT 120MM (MISCELLANEOUS)
SYSTEM LAPSCP GELPORT 120MM (MISCELLANEOUS) IMPLANT
TAPE CLOTH 4X10 WHT NS (GAUZE/BANDAGES/DRESSINGS) ×2 IMPLANT
TOWEL OR 17X26 10 PK STRL BLUE (TOWEL DISPOSABLE) ×4 IMPLANT
TOWEL OR NON WOVEN STRL DISP B (DISPOSABLE) ×3 IMPLANT
TRAY FOLEY W/METER SILVER 14FR (SET/KITS/TRAYS/PACK) ×1 IMPLANT
TRAY FOLEY W/METER SILVER 16FR (SET/KITS/TRAYS/PACK) ×3 IMPLANT
TRAY LAPAROSCOPIC (CUSTOM PROCEDURE TRAY) ×3 IMPLANT
TROCAR BLADELESS OPT 5 100 (ENDOMECHANICALS) ×3 IMPLANT
TROCAR XCEL NON-BLD 11X100MML (ENDOMECHANICALS) ×1 IMPLANT
TUBING INSUF HEATED (TUBING) ×2 IMPLANT
YANKAUER SUCT BULB TIP 10FT TU (MISCELLANEOUS) ×4 IMPLANT
YANKAUER SUCT BULB TIP NO VENT (SUCTIONS) ×3 IMPLANT

## 2015-10-02 NOTE — Progress Notes (Signed)
Subjective: Condition unchanged.  Still has sharp pain mostly in midline below umbilicus.  No vomiting.  Minimal stools. No better and no worse.  Afebrile.  Heart rate 43.  WBC 10,100. His wife is here.  We had a long conversation.  He wants to go ahead with surgery today  Objective: Vital signs in last 24 hours: Temp:  [97.6 F (36.4 C)-98.3 F (36.8 C)] 97.6 F (36.4 C) (07/14 0606) Pulse Rate:  [43-75] 43 (07/14 0606) Resp:  [15-20] 20 (07/14 0606) BP: (112-143)/(60-100) 112/60 mmHg (07/14 0606) SpO2:  [96 %-100 %] 97 % (07/14 0606) Weight:  [93.441 kg (206 lb)] 93.441 kg (206 lb) (07/13 0943) Last BM Date: 10/01/15  Intake/Output from previous day:   Intake/Output this shift:    General appearance: Alert.  Minimal distress.  Mental status normal.  Good insight. Resp: clear to auscultation bilaterally GI: Soft.  Nondistended.  Tender just below and to the right of umbilicus.  Wonder if I feel a little bit of thickening.  Nothing dramatic.  No incisions.  No hernias.  Lab Results:   Recent Labs  10/01/15 1012 10/02/15 0529  WBC 11.2* 10.1  HGB 14.8 14.6  HCT 44.4 43.0  PLT 223 204   BMET  Recent Labs  10/01/15 1005  NA 139  K 3.6  CL 104  CO2 29  GLUCOSE 106*  BUN 8  CREATININE 1.06  CALCIUM 9.1   PT/INR No results for input(s): LABPROT, INR in the last 72 hours. ABG No results for input(s): PHART, HCO3 in the last 72 hours.  Invalid input(s): PCO2, PO2  Studies/Results: Ct Abdomen Pelvis W Contrast  10/01/2015  CLINICAL DATA:  Abdominal pain and diarrhea EXAM: CT ABDOMEN AND PELVIS WITH CONTRAST TECHNIQUE: Multidetector CT imaging of the abdomen and pelvis was performed using the standard protocol following bolus administration of intravenous contrast. CONTRAST:  ISOVUE-300 IOPAMIDOL (ISOVUE-300) INJECTION 61% COMPARISON:  08/28/2015 FINDINGS: Lower chest:  No acute findings. Hepatobiliary: No masses or other significant abnormality. Pancreas:  No mass, inflammatory changes, or other significant abnormality. Spleen: Within normal limits in size and appearance. Adrenals/Urinary Tract: No masses identified. No evidence of hydronephrosis. Stomach/Bowel: Previously seen small bowel obstruction has resolved in the interval. There remains however evidence of a focal fluid collection which appears to arise from the small bowel. It is similar to that seen on the prior exam it demonstrates some persistent inflammatory change. This is again the hila suspicious for underlying inflammatory bowel disease such as Crohn's. The extra luminal air has resolved in the interval from the prior exam. These surrounding small lymph nodes are again identified and stable. Vascular/Lymphatic: No other pathologically enlarged lymph nodes. No evidence of abdominal aortic aneurysm. Reproductive: No mass or other significant abnormality. Other: None. Musculoskeletal:  No suspicious bone lesions identified. IMPRESSION: Resolution of previously seen small bowel obstruction. There remains however a a small fluid collection with associated phlegmonous changes adjacent to the bowel loops of the mid to distal small bowel. This again raises suspicion for inflammatory bowel disease. The extra luminal air seen previously has resolved in the interval. No other focal abnormality is seen. Electronically Signed   By: Alcide Clever M.D.   On: 10/01/2015 12:44    Anti-infectives: Anti-infectives    None      Assessment/Plan: s/p Procedure(s): LAPAROSCOPIC SMALL BOWEL RESECTION POSSIBLE OPEN LAPAROTOMY  Partial SBO secondary to strictured and inflamed small bowel segment.  Possible microperforation last month.  Differential diagnosis includes Crohn's disease which  is most likely but other possibilities including Meckel's diverticulum, other small bowel diverticulitis, lymphoma, other neoplasia, or obscure problems such as foreign body perforation must be considered.  He has lost  significant weight, 25 lbs. Over 6 weeks, and it sounds like this is due to obstructive symptoms and food avoidance to control symptoms.  Because of persistent symptomatic partial obstruction despite adequate medical management for presumed Crohn's disease, and because of the uncertainty of diagnosis, he wants to go ahead with surgical expiration and resection of the segment of small bowel today.  Once again I explained the indications, details, techniques, and numerous risk of the surgery with him and his wife.  Differential diagnosis was discussed in detail.  He is aware the risk of bleeding, infection, conversion to open laparotomy, wound healing problems such as infection or hernia, injury to adjacent organs with major reconstructive surgery, low risk of ostomy formation.  He understands all these issues.  All his questions are answered.  He agrees with this plan.  We will proceed with his surgery later this morning.  LOS: 1 day    Ernestene Mention 10/02/2015

## 2015-10-02 NOTE — Op Note (Signed)
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 Derrell Lolling, M.D., FACS  Assistant:                      OR staff  Operative Indications:   This is a 35 year old Caucasian male who has had problems with abdominal pain for about 6 weeks but has had loose stools prior to that.  He was hospitalized about 5 weeks ago for partial small bowel obstruction and possible microperforation of the mid to distal ileum.  He was thought to have Crohn's disease.  He took antibiotics for about a week and a half and has been on steroids ever since which is been tapered down to 25 mg of prednisone a day.  Over the past 6 weeks he's had trouble eating he gets pain and cramps when he eats and he's lost 25 pounds.  He was admitted to the hospital yesterday and there still appears to be an inflammatory stricture of the distal ileum although is not as much bowel dilatation.  Because of the continued symptoms, weight loss, partial obstruction, and uncertain diagnosis surgery was offered and he was in favor of going ahead with that today  Operative Findings:       There was an inflammatory mass of the distal ileum about 2-1/2 feet proximal to the ileocecal valve.  This was densely adherent to the retroperitoneum and required some sharp and blunt dissection to mobilize this up.  There were 2 or 3 loops of small bowel around this.  I wound up resecting about a foot and a half of small bowel and performing a primary anastomosis.  There was no gross evidence of abscess,  ischemia or Crohn's disease.  There was no adenopathy.  Dr. Jimmy Picket looked at the specimen and said that he thought this was an inflammatory process, possible Meckel's diverticulitis, Crohn's disease possible but less  likely.  He stated that he did not think that this was a malignancy and we did not need to do a node dissection.  Procedure in Detail:          Following the induction of general endotracheal anesthesia a Foley catheter was placed.  The abdomen was prepped and draped in sterile fashion.  Intravenous antibiotics were given.  Surgical timeout was performed.  The first trocar was placed with an optical entry.  This was uneventful.  Pneumoperitoneum was created.  No evidence of intra-abdominal injury.  2 other 5 mm trochars were placed.  The abdomen was explored laparoscopically with findings as described above.  I was able to identify the right colon, cecum, appendix, and ileocecal valve.  I was able to run the small bowel up to the inflammatory mass and proximally all the way to the ligament of Treitz and rest of the small bowel looked perfectly normal.  I tried to bluntly tease the inflammatory mass off of the retroperitoneum but it was very densely adherent.  The liver and gallbladder look  normal.  Greater curvature of the stomach and sigmoid colon looked normal.     I made a short midline incision both above and below the umbilicus and divided the fascia in the midline.  Wound protector was placed.  I was able to place retractors and sharply dissected inflammatory mass up off of the retroperitoneum and ultimately was able to deliver it into the wound.  I divided the small bowel proximal and distal to this with the GIA stapler.  Mesenteric vessels were divided with the LigaSure device and some were suture ligated with 2-0 silk ties.  The specimen was sent to the lab.  Dr. Luisa Hart took a look at this and thought it was an inflammatory process as described above.     The ends of the proximal and distal small bowel resection area where a little bit ischemic and so I resected a couple of inches back on either side and it looked very good.  Small bowel anastomosis was created with a GIA stapling device and the common  defect was closed with a TA 60 stapling device.  Further silk sutures were placed to reinforce the staple line at critical points.    The mesentery was closed with interrupted silk sutures.  The abdomen and pelvis were irrigated with saline.  There was no bleeding.  I returned the small bowel to its anatomic position and brought the omentum back down over this.  The midline fascia was closed with a running suture of #1 double standard PDS and a few interrupted #1 Novafil.  The wounds were irrigated with saline and the skin closed with skin staples and a few Telfa wicks.  Clean bandages replaced the patient taken to PACU in stable condition.  EBL 100 mL or less.  Counts correct.  Complications none.     Angelia Mould. Derrell Lolling, M.D., FACS General and Minimally Invasive Surgery Breast and Colorectal Surgery  10/02/2015 11:48 AM

## 2015-10-02 NOTE — Progress Notes (Signed)
PROGRESS NOTE  Luis Pineda ZOX:[id number] DOB: [birth year] DOA: 10/01/2015 PCP: Thora Lance, MD   LOS: 1 day   Brief Narrative: 35 y.o. male with history of sbo thought due to inflammatory bowel disease, has been on steroids, however, his symptom persisted, he presented to [facility] ED a CT abdomen is done, showed resolution of previously seen sbo, but persistent changes in the mid and distal small bowel which is concerning for IBD. GI and Surgery consulted.  Assessment & Plan: Active Problems:   SBO (small bowel obstruction) (HCC)   Abdominal pain   pSBO - no histologic diagnosis yet as to what is causing his strictured small bowel segment - persistent despite steroids as an outpatient - surgery today - slow steroid taper post op as he has been on prednisone as an outpatient for few weeks per GI - low operative risk, no known history of heart/lung disease   DVT prophylaxis: per surgery post op Code Status: Full Family Communication: wife bedside Disposition Plan: TBD  Consultants:   General surgery   GI  Procedures:   None   Antimicrobials:  None    Subjective: - no chest pain, shortness of breath, no nausea or vomiting.  - mild abdominal pain - awaiting surgery   Objective: Filed Vitals:   10/01/15 1405 10/01/15 1816 10/01/15 2100 10/02/15 0606  BP: 122/76 136/80 115/61 112/60  Pulse: 65 59 52 43  Temp:  97.6 F (36.4 C) 98.3 F (36.8 C) 97.6 F (36.4 C)  TempSrc:  Oral Oral Oral  Resp: 15 18 19 20   Height:      Weight:      SpO2: 96% 97% 97% 97%    Intake/Output Summary (Last 24 hours) at 10/02/15 1058 Last data filed at 10/02/15 1005  Gross per 24 hour  Intake   1000 ml  Output      0 ml  Net   1000 ml   Filed Weights   10/01/15 0943  Weight: 93.441 kg (206 lb)    Examination: Constitutional: NAD Filed Vitals:   10/01/15 1405 10/01/15 1816 10/01/15 2100 10/02/15 0606  BP: 122/76 136/80 115/61 112/60  Pulse: 65 59 52 43  Temp:  97.6  F (36.4 C) 98.3 F (36.8 C) 97.6 F (36.4 C)  TempSrc:  Oral Oral Oral  Resp: 15 18 19 20   Height:      Weight:      SpO2: 96% 97% 97% 97%   Respiratory: clear to auscultation bilaterally, no wheezing, no crackles.  Cardiovascular: Regular rate and rhythm, no murmurs / rubs / gallops. No LE edema. 2+ pedal pulses.  Abdomen: Bowel sounds positive.   Data Reviewed: I have personally reviewed following labs and imaging studies  CBC:  Recent Labs Lab 10/01/15 0901 10/01/15 1012 10/02/15 0529  WBC 10.3 11.2* 10.1  NEUTROABS 7.4  --   --   HGB 14.4 14.8 14.6  HCT 42.0 44.4 43.0  MCV 85.9 88.6 86.7  PLT 219.0 223 204   Basic Metabolic Panel:  Recent Labs Lab 10/01/15 1005 10/02/15 0529  NA 139 138  K 3.6 4.4  CL 104 104  CO2 29 26  GLUCOSE 106* 137*  BUN 8 9  CREATININE 1.06 0.91  CALCIUM 9.1 9.2  MG  --  2.0   GFR: Estimated Creatinine Clearance: 127.6 mL/min (by C-G formula based on Cr of 0.91). Liver Function Tests:  Recent Labs Lab 10/01/15 1005  AST 13*  ALT 26  ALKPHOS 56  BILITOT  1.3*  PROT 7.0  ALBUMIN 4.2    Recent Labs Lab 10/01/15 1005  LIPASE 26   No results for input(s): AMMONIA in the last 168 hours. Coagulation Profile:  Recent Labs Lab 10/02/15 0529  INR 1.16   Urine analysis:    Component Value Date/Time   COLORURINE YELLOW 10/01/2015 1000   APPEARANCEUR CLEAR 10/01/2015 1000   LABSPEC 1.020 10/01/2015 1000   PHURINE 6.0 10/01/2015 1000   GLUCOSEU NEGATIVE 10/01/2015 1000   HGBUR NEGATIVE 10/01/2015 1000   BILIRUBINUR NEGATIVE 10/01/2015 1000   KETONESUR NEGATIVE 10/01/2015 1000   PROTEINUR NEGATIVE 10/01/2015 1000   NITRITE NEGATIVE 10/01/2015 1000   LEUKOCYTESUR NEGATIVE 10/01/2015 1000   Sepsis Labs: Invalid input(s): PROCALCITONIN, LACTICIDVEN  No results found for this or any previous visit (from the past 240 hour(s)).   Radiology Studies: Ct Abdomen Pelvis W Contrast  10/01/2015  CLINICAL DATA:  Abdominal  pain and diarrhea EXAM: CT ABDOMEN AND PELVIS WITH CONTRAST TECHNIQUE: Multidetector CT imaging of the abdomen and pelvis was performed using the standard protocol following bolus administration of intravenous contrast. CONTRAST:  ISOVUE-300 IOPAMIDOL (ISOVUE-300) INJECTION 61% COMPARISON:  08/28/2015 FINDINGS: Lower chest:  No acute findings. Hepatobiliary: No masses or other significant abnormality. Pancreas: No mass, inflammatory changes, or other significant abnormality. Spleen: Within normal limits in size and appearance. Adrenals/Urinary Tract: No masses identified. No evidence of hydronephrosis. Stomach/Bowel: Previously seen small bowel obstruction has resolved in the interval. There remains however evidence of a focal fluid collection which appears to arise from the small bowel. It is similar to that seen on the prior exam it demonstrates some persistent inflammatory change. This is again the hila suspicious for underlying inflammatory bowel disease such as Crohn's. The extra luminal air has resolved in the interval from the prior exam. These surrounding small lymph nodes are again identified and stable. Vascular/Lymphatic: No other pathologically enlarged lymph nodes. No evidence of abdominal aortic aneurysm. Reproductive: No mass or other significant abnormality. Other: None. Musculoskeletal:  No suspicious bone lesions identified. IMPRESSION: Resolution of previously seen small bowel obstruction. There remains however a a small fluid collection with associated phlegmonous changes adjacent to the bowel loops of the mid to distal small bowel. This again raises suspicion for inflammatory bowel disease. The extra luminal air seen previously has resolved in the interval. No other focal abnormality is seen. Electronically Signed   By: Alcide Clever M.D.   On: 10/01/2015 12:44   Scheduled Meds: . [MAR Hold] methylPREDNISolone (SOLU-MEDROL) injection  40 mg Intravenous Q8H   Continuous Infusions: .  dextrose 5% lactated ringers with KCl 20 mEq/L 75 mL/hr at 10/01/15 1902   Pamella Pert, MD, PhD [facility] Pager [phone]  If 7PM-7AM, please contact night-coverage [email or web address] Password [facility] 10/02/2015, 10:58 AM

## 2015-10-02 NOTE — Transfer of Care (Signed)
Immediate Anesthesia Transfer of Care Note  Patient: Luis Pineda  Procedure(s) Performed: Procedure(s): LAPAROSCOPIC SMALL BOWEL RESECTION  (N/A)  Patient Location: PACU  Anesthesia Type:General  Level of Consciousness:  sedated, patient cooperative and responds to stimulation  Airway & Oxygen Therapy:Patient Spontanous Breathing and Patient connected to face mask oxgen  Post-op Assessment:  Report given to PACU RN and Post -op Vital signs reviewed and stable  Post vital signs:  Reviewed and stable  Last Vitals:  Filed Vitals:   10/01/15 2100 10/02/15 0606  BP: 115/61 112/60  Pulse: 52 43  Temp: 36.8 C 36.4 C  Resp: 19 20    Complications: No apparent anesthesia complications

## 2015-10-02 NOTE — Anesthesia Postprocedure Evaluation (Signed)
Anesthesia Post Note  Patient: Luis Pineda  Procedure(s) Performed: Procedure(s) (LRB): LAPAROSCOPIC SMALL BOWEL RESECTION  (N/A)  Patient location during evaluation: PACU Anesthesia Type: General Level of consciousness: sedated Pain management: pain level controlled Vital Signs Assessment: post-procedure vital signs reviewed and stable Respiratory status: spontaneous breathing Cardiovascular status: stable Postop Assessment: no signs of nausea or vomiting Anesthetic complications: no     Last Vitals:  Filed Vitals:   10/02/15 1215 10/02/15 1230  BP: 140/99 150/91  Pulse: 66 107  Temp:    Resp: 14 15    Last Pain:  Filed Vitals:   10/02/15 1233  PainSc: 9    Pain Goal: Patients Stated Pain Goal: 4 (10/01/15 1845)               Loys Hoselton JR,JOHN Susann Givens

## 2015-10-02 NOTE — Anesthesia Procedure Notes (Signed)
Procedure Name: Intubation Date/Time: 10/02/2015 9:29 AM Performed by: Epimenio Sarin Pre-anesthesia Checklist: Patient identified, Emergency Drugs available, Suction available, Patient being monitored and Timeout performed Patient Re-evaluated:Patient Re-evaluated prior to inductionOxygen Delivery Method: Circle system utilized Preoxygenation: Pre-oxygenation with 100% oxygen Intubation Type: IV induction, Rapid sequence and Cricoid Pressure applied Laryngoscope Size: Mac and 3 Grade View: Grade I Tube type: Oral Tube size: 7.5 mm Number of attempts: 1 Airway Equipment and Method: Stylet Placement Confirmation: ETT inserted through vocal cords under direct vision,  positive ETCO2 and breath sounds checked- equal and bilateral Secured at: 23 cm Tube secured with: Tape Dental Injury: Teeth and Oropharynx as per pre-operative assessment

## 2015-10-02 NOTE — Progress Notes (Signed)
Initial Nutrition Assessment  DOCUMENTATION CODES:   Not applicable  INTERVENTION:  -RD to continue to monitor  NUTRITION DIAGNOSIS:   Inadequate oral intake related to poor appetite, nausea, other (see comment) (diarrhea) as evidenced by per patient/family report.  GOAL:   Patient will meet greater than or equal to 90% of their needs  MONITOR:   PO intake, Skin, I & O's, Labs, Supplement acceptance  REASON FOR ASSESSMENT:   Malnutrition Screening Tool    ASSESSMENT:   Luis Pineda is a 35 yo man who presented with h/o sbo thought due to inflammatory bowel disease has been on steroids, however, his symptom persisted,he presented to [facility] ED a CT abdomen is done, showed resolution of previously seen sbo, but persistent changes in teh mid and distal small bowel which is concerning for IBD, he was given prn pain meds, gi consulted, hospitalist called to admit the patient.  Came to speak with pt at bedside, was sleeping soundly, did not disturb.Pt is s/p small bowel resection today. Has a hx of SBOs. During previous admissions he had a good appetite, was consuming 100% of meals.  Per chart pt claims he has lost 25#/11% over 6 weeks, which is severe for that time frame. He is still overweight per BMI Nutrition-Focused physical exam completed. Findings are no fat depletion, no muscle depletion, and no edema.  Looking at him, he does not appear to display any muscle wasting or fat depletions.  Labs and Medications reviewed: Solumedrol D5 @ 25mL/hr --> 306 calories Ringers @ 147mL/hr   Diet Order:  Diet NPO time specified Except for: Ice Chips, Sips with Meds  Skin:  Wound (see comment) (Closed wound to abdomen)  Last BM:  7/13  Height:   Ht Readings from Last 1 Encounters:  10/01/15 5\' 10"  (1.778 m)    Weight:   Wt Readings from Last 1 Encounters:  10/01/15 206 lb (93.441 kg)    Ideal Body Weight:  75.45 kg  BMI:  Body mass index is 29.56 kg/(m^2).  Estimated  Nutritional Needs:   Kcal:  1900-2400  Protein:  93-110 grams  Fluid:  >/= 1.9L  EDUCATION NEEDS:   No education needs identified at this time  Dionne Ano. Clayburn Weekly, MS, RD LDN Inpatient Clinical Dietitian Pager [phone]

## 2015-10-02 NOTE — Progress Notes (Signed)
Pt. Was  unable to void, bladdar scanned earlier in shift for pt. Refused I&O cath at that time. Pt. Still had not voided agreed to have I & O. Pt. I & O cath for 800 ml. Will continue to monitor.

## 2015-10-02 NOTE — Anesthesia Preprocedure Evaluation (Addendum)
Anesthesia Evaluation  Patient identified by MRN, date of birth, ID band Patient awake    Reviewed: Allergy & Precautions, H&P , NPO status , Patient's Chart, lab work & pertinent test results  Airway Mallampati: I  TM Distance: >3 FB Neck ROM: full    Dental no notable dental hx. (+) Teeth Intact   Pulmonary neg pulmonary ROS,    Pulmonary exam normal        Cardiovascular negative cardio ROS Normal cardiovascular exam     Neuro/Psych negative neurological ROS  negative psych ROS   GI/Hepatic negative GI ROS, Neg liver ROS, Suspected Crohn's disease   Endo/Other  negative endocrine ROS  Renal/GU negative Renal ROS     Musculoskeletal   Abdominal Normal abdominal exam  (+)   Peds  Hematology negative hematology ROS (+)   Anesthesia Other Findings   Reproductive/Obstetrics negative OB ROS                            Anesthesia Physical Anesthesia Plan  ASA: II  Anesthesia Plan: General   Post-op Pain Management:    Induction: Intravenous  Airway Management Planned: Oral ETT  Additional Equipment:   Intra-op Plan:   Post-operative Plan: Extubation in OR  Informed Consent: I have reviewed the patients History and Physical, chart, labs and discussed the procedure including the risks, benefits and alternatives for the proposed anesthesia with the patient or authorized representative who has indicated his/her understanding and acceptance.   Dental advisory given  Plan Discussed with: CRNA and Surgeon  Anesthesia Plan Comments:        Anesthesia Quick Evaluation

## 2015-10-02 NOTE — Progress Notes (Signed)
I reviewed Dr Luis Pineda note, and I just said hello to Luis Pineda as he is heading down to surgery today. He will be feeling better after that, then we will discuss follow up with Dr Luis Pineda and long term plan for biologic therapy if Crohn's disease is confirmed.  Please be sure he is appropriately tapered off steroids in the post-operative setting, as he has been on them for 4-5 weeks.  I will check back sometime this weekend, please call if urgent issues arise.

## 2015-10-03 LAB — CBC
HEMATOCRIT: 40.4 % (ref 39.0–52.0)
HEMOGLOBIN: 13.4 g/dL (ref 13.0–17.0)
MCH: 28.9 pg (ref 26.0–34.0)
MCHC: 33.2 g/dL (ref 30.0–36.0)
MCV: 87.3 fL (ref 78.0–100.0)
Platelets: 225 10*3/uL (ref 150–400)
RBC: 4.63 MIL/uL (ref 4.22–5.81)
RDW: 13.3 % (ref 11.5–15.5)
WBC: 13.2 10*3/uL — ABNORMAL HIGH (ref 4.0–10.5)

## 2015-10-03 LAB — BASIC METABOLIC PANEL
ANION GAP: 7 (ref 5–15)
BUN: 8 mg/dL (ref 6–20)
CHLORIDE: 103 mmol/L (ref 101–111)
CO2: 28 mmol/L (ref 22–32)
Calcium: 8.7 mg/dL — ABNORMAL LOW (ref 8.9–10.3)
Creatinine, Ser: 1 mg/dL (ref 0.61–1.24)
GFR calc non Af Amer: >60 mL/min (ref >60)
GLUCOSE: 126 mg/dL — AB (ref 65–99)
Potassium: 4.1 mmol/L (ref 3.5–5.1)
Sodium: 138 mmol/L (ref 135–145)

## 2015-10-03 LAB — HEMOGLOBIN A1C
HEMOGLOBIN A1C: 5.2 % (ref 4.8–5.6)
Mean Plasma Glucose: 103 mg/dL

## 2015-10-03 MED ORDER — KETOROLAC TROMETHAMINE 15 MG/ML IJ SOLN
15.0000 mg | Freq: Three times a day (TID) | INTRAMUSCULAR | Status: AC
  Administered 2015-10-03 – 2015-10-04 (×3): 15 mg via INTRAVENOUS
  Filled 2015-10-03 (×3): qty 1

## 2015-10-03 NOTE — Progress Notes (Addendum)
He is recovering well after surgery.  I was in the OR for about 30 minutes of his case yesterday, and the gross pathology did not clearly look like Crohn's.  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 He should have a quick wean off steroids.  We will await final pathology.  I will alert Dr Adela Lank of developments.  I will sign off - please call as the need arises.  Marland KitchenAmada Jupiter, MD [facility]

## 2015-10-03 NOTE — Progress Notes (Signed)
Per pt. And spouse pt voided this am however he did have some difficulty starting stream. Will continue to monitor.

## 2015-10-03 NOTE — Progress Notes (Signed)
  Progress Note: General Surgery Service   Subjective: No nausea or vomiting, moderate pain issues, ambulating in room, +UOP  Objective: Vital signs in last 24 hours: Temp:  [97.5 F (36.4 C)-98.5 F (36.9 C)] 98.1 F (36.7 C) (07/15 0649) Pulse Rate:  [49-107] 49 (07/15 0649) Resp:  [14-16] 14 (07/15 0649) BP: (135-164)/(72-99) 135/88 mmHg (07/15 0649) SpO2:  [97 %-100 %] 98 % (07/15 0649) Last BM Date: 10/01/15  Intake/Output from previous day: 07/14 0701 - 07/15 0700 In: 3392 [I.V.:3392] Out: 1900 [Urine:1800; Blood:100] Intake/Output this shift:    Lungs: CTAB  Cardiovascular: bradycardic  Abd: soft, ATTP right side, wounds c/d/i  Extremities: no edema  Neuro: AOx4  Lab Results: CBC   Recent Labs  10/02/15 0529 10/03/15 0515  WBC 10.1 13.2*  HGB 14.6 13.4  HCT 43.0 40.4  PLT 204 225   BMET  Recent Labs  10/02/15 0529 10/03/15 0515  NA 138 138  K 4.4 4.1  CL 104 103  CO2 26 28  GLUCOSE 137* 126*  BUN 9 8  CREATININE 0.91 1.00  CALCIUM 9.2 8.7*   PT/INR  Recent Labs  10/02/15 0529  LABPROT 14.5  INR 1.16   ABG No results for input(s): PHART, HCO3 in the last 72 hours.  Invalid input(s): PCO2, PO2  Studies/Results:  Anti-infectives: Anti-infectives    Start     Dose/Rate Route Frequency Ordered Stop   10/02/15 2000  cefoTEtan (CEFOTAN) 2 g in dextrose 5 % 50 mL IVPB     2 g 100 mL/hr over 30 Minutes Intravenous Every 12 hours 10/02/15 1259 10/02/15 2044   10/02/15 0845  cefoTEtan (CEFOTAN) 2 g in dextrose 5 % 50 mL IVPB    Comments:  Infusion to begin in OR.   2 g 100 mL/hr over 30 Minutes Intravenous On call to O.R. 10/02/15 2248 10/02/15 0932      Medications: Scheduled Meds: . alvimopan  12 mg Oral BID  . enoxaparin (LOVENOX) injection  40 mg Subcutaneous Q24H  . famotidine (PEPCID) IV  20 mg Intravenous Q12H  . hydrocortisone sod succinate (SOLU-CORTEF) inj  50 mg Intravenous Q24H   Continuous Infusions: . dextrose  5% lactated ringers with KCl 20 mEq/L 75 mL/hr at 10/03/15 0636  . lactated ringers with kcl 125 mL/hr at 10/02/15 1407   PRN Meds:.HYDROcodone-acetaminophen, HYDROmorphone (DILAUDID) injection, methocarbamol, morphine injection, ondansetron **OR** ondansetron (ZOFRAN) IV, promethazine  Assessment/Plan: Patient Active Problem List   Diagnosis Date Noted  . Abdominal pain 10/01/2015  . SBO (small bowel obstruction) (HCC) 08/28/2015  . Generalized abdominal pain 08/28/2015  . RLQ abdominal pain 08/28/2015  . Nausea and vomiting 08/28/2015  . Enteritis 08/28/2015   s/p Procedure(s): LAPAROSCOPIC SMALL BOWEL RESECTION  10/02/2015 -ok for clear liquids -will add 24h toradol for post op pain -can also try the ordered muscle relaxant or oral narcotics if taking liquids   LOS: 2 days   Rodman Pickle, MD Pg# [phone] [facility]

## 2015-10-03 NOTE — Progress Notes (Signed)
PROGRESS NOTE  Luis Pineda:[id number] DOB: October 03, 1980 DOA: 10/01/2015 PCP: Thora Lance, MD   LOS: 2 days   Brief Narrative: 35 y.o. male with history of sbo thought due to inflammatory bowel disease, has been on steroids, however, his symptom persisted, he presented to [facility] ED a CT abdomen is done, showed resolution of previously seen sbo, but persistent changes in the mid and distal small bowel which is concerning for IBD. GI and Surgery consulted.  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  - mild abdominal "soreness" at the surgical site  Objective: Filed Vitals:   10/02/15 1444 10/02/15 1630 10/02/15 2230 10/03/15 0649  BP: 136/72 148/92 164/95 135/88  Pulse: 69 51 67 49  Temp: 97.5 F (36.4 C)  97.5 F (36.4 C) 98.1 F (36.7 C)  TempSrc: Oral  Oral Oral  Resp: Height:      Weight:      SpO2: 97% 100% 100% 98%    Intake/Output Summary (Last 24 hours) at 10/03/15 1230 Last data filed at 10/03/15 1228  Gross per 24 hour  Intake    952 ml  Output   1600 ml  Net   -648 ml   Filed Weights   10/01/15 0943  Weight: 93.441 kg (206 lb)    Examination: Constitutional: NAD Filed Vitals:   10/02/15 1444  10/02/15 1630 10/02/15 2230 10/03/15 0649  BP: 136/72 148/92 164/95 135/88  Pulse: 69 51 67 49  Temp: 97.5 F (36.4 C)  97.5 F (36.4 C) 98.1 F (36.7 C)  TempSrc: Oral  Oral Oral  Resp: Height:      Weight:      SpO2: 97% 100% 100% 98%   Respiratory: clear to auscultation bilaterally, no wheezing, no crackles.  Cardiovascular: Regular rate and rhythm, no murmurs / rubs / gallops. No LE edema. 2+ pedal pulses.  Abdomen: Bowel sounds positive. Dressing with minimal amount of blood  Data Reviewed: I have personally reviewed following labs and imaging studies  CBC:  Recent Labs Lab 10/01/15 0901 10/01/15 1012 10/02/15 0529 10/03/15 0515  WBC 10.3 11.2* 10.1 13.2*  NEUTROABS 7.4  --   --   --   HGB 14.4 14.8 14.6 13.4  HCT 42.0 44.4 43.0 40.4  MCV 85.9 88.6 86.7 87.3  PLT 219.0 223 204 225   Basic Metabolic Panel:  Recent Labs Lab 10/01/15 1005 10/02/15 0529 10/03/15 0515  NA 139 138 138  K 3.6 4.4 4.1  CL 104 104 103  CO2 GLUCOSE 106* 137* 126*  BUN CREATININE 1.06 0.91 1.00  CALCIUM 9.1 9.2 8.7*  MG  --  2.0  --  GFR: Estimated Creatinine Clearance: 116.2 mL/min (by C-G formula based on Cr of 1). Liver Function Tests:  Recent Labs Lab 10/01/15 1005  AST 13*  ALT 26  ALKPHOS 56  BILITOT 1.3*  PROT 7.0  ALBUMIN 4.2    Recent Labs Lab 10/01/15 1005  LIPASE 26   No results for input(s): AMMONIA in the last 168 hours. Coagulation Profile:  Recent Labs Lab 10/02/15 0529  INR 1.16   Urine analysis:    Component Value Date/Time   COLORURINE YELLOW 10/01/2015 1000   APPEARANCEUR CLEAR 10/01/2015 1000   LABSPEC 1.020 10/01/2015 1000   PHURINE 6.0 10/01/2015 1000   GLUCOSEU NEGATIVE 10/01/2015 1000   HGBUR NEGATIVE 10/01/2015 1000   BILIRUBINUR NEGATIVE 10/01/2015 1000   KETONESUR NEGATIVE 10/01/2015 1000   PROTEINUR NEGATIVE 10/01/2015 1000   NITRITE NEGATIVE 10/01/2015 1000   LEUKOCYTESUR NEGATIVE  10/01/2015 1000   Sepsis Labs: Invalid input(s): PROCALCITONIN, LACTICIDVEN  No results found for this or any previous visit (from the past 240 hour(s)).   Radiology Studies: No results found. Scheduled Meds: . alvimopan  12 mg Oral BID  . enoxaparin (LOVENOX) injection  40 mg Subcutaneous Q24H  . famotidine (PEPCID) IV  20 mg Intravenous Q12H  . hydrocortisone sod succinate (SOLU-CORTEF) inj  50 mg Intravenous Q24H  . ketorolac  15 mg Intravenous Q8H   Continuous Infusions: . dextrose 5% lactated ringers with KCl 20 mEq/L 75 mL/hr at 10/03/15 0636  . lactated ringers with kcl 125 mL/hr at 10/02/15 1407   Pamella Pert, MD, PhD [facility] Pager [phone]  If 7PM-7AM, please contact night-coverage [email or web address] Password [email or web address] 10/03/2015, 12:30 PM

## 2015-10-04 LAB — CBC
HEMATOCRIT: 37.2 % — AB (ref 39.0–52.0)
HEMOGLOBIN: 12.1 g/dL — AB (ref 13.0–17.0)
MCH: 29.2 pg (ref 26.0–34.0)
MCHC: 32.5 g/dL (ref 30.0–36.0)
MCV: 89.9 fL (ref 78.0–100.0)
PLATELETS: 199 10*3/uL (ref 150–400)
RBC: 4.14 MIL/uL — AB (ref 4.22–5.81)
RDW: 13.5 % (ref 11.5–15.5)
WBC: 6.5 10*3/uL (ref 4.0–10.5)

## 2015-10-04 LAB — BASIC METABOLIC PANEL
ANION GAP: 4 — AB (ref 5–15)
BUN: 6 mg/dL (ref 6–20)
CHLORIDE: 103 mmol/L (ref 101–111)
CO2: 32 mmol/L (ref 22–32)
Calcium: 8.6 mg/dL — ABNORMAL LOW (ref 8.9–10.3)
Creatinine, Ser: 0.92 mg/dL (ref 0.61–1.24)
GFR calc Af Amer: >60 mL/min (ref >60)
GFR calc non Af Amer: >60 mL/min (ref >60)
Glucose, Bld: 101 mg/dL — ABNORMAL HIGH (ref 65–99)
POTASSIUM: 3.9 mmol/L (ref 3.5–5.1)
Sodium: 139 mmol/L (ref 135–145)

## 2015-10-04 MED ORDER — KETOROLAC TROMETHAMINE 15 MG/ML IJ SOLN
15.0000 mg | Freq: Three times a day (TID) | INTRAMUSCULAR | Status: AC
  Administered 2015-10-04 – 2015-10-05 (×6): 15 mg via INTRAVENOUS
  Filled 2015-10-04 (×6): qty 1

## 2015-10-04 NOTE — Progress Notes (Signed)
  Progress Note: General Surgery Service   Subjective: Pain continues to be issue, toradol helped yesterday, ambulating, tolerating liquids, +flatus today  Objective: Vital signs in last 24 hours: Temp:  [97.6 F (36.4 C)-98.7 F (37.1 C)] 97.6 F (36.4 C) (07/16 0604) Pulse Rate:  [46-49] 49 (07/16 0604) Resp:  [16-17] 17 (07/16 0604) BP: (133-153)/(90-91) 133/90 mmHg (07/16 0604) SpO2:  [99 %] 99 % (07/16 0604) Last BM Date: 10/01/15  Intake/Output from previous day: 07/15 0701 - 07/16 0700 In: 2651.3 [P.O.:1200; I.V.:1451.3] Out: 900 [Urine:900] Intake/Output this shift:    Lungs: CTAB  Cardiovascular: RRR  Abd: soft, ATTP right side, incisions without seep through  Extremities: no edema  Neuro: AOx4  Lab Results: CBC   Recent Labs  10/03/15 0515 10/04/15 0518  WBC 13.2* 6.5  HGB 13.4 12.1*  HCT 40.4 37.2*  PLT 225 199   BMET  Recent Labs  10/03/15 0515 10/04/15 0518  NA 138 139  K 4.1 3.9  CL 103 103  CO2 28 32  GLUCOSE 126* 101*  BUN 8 6  CREATININE 1.00 0.92  CALCIUM 8.7* 8.6*   PT/INR  Recent Labs  10/02/15 0529  LABPROT 14.5  INR 1.16   ABG No results for input(s): PHART, HCO3 in the last 72 hours.  Invalid input(s): PCO2, PO2  Studies/Results:  Anti-infectives: Anti-infectives    Start     Dose/Rate Route Frequency Ordered Stop   10/02/15 2000  cefoTEtan (CEFOTAN) 2 g in dextrose 5 % 50 mL IVPB     2 g 100 mL/hr over 30 Minutes Intravenous Every 12 hours 10/02/15 1259 10/02/15 2044   10/02/15 0845  cefoTEtan (CEFOTAN) 2 g in dextrose 5 % 50 mL IVPB    Comments:  Infusion to begin in OR.   2 g 100 mL/hr over 30 Minutes Intravenous On call to O.R. 10/02/15 1610 10/02/15 0932      Medications: Scheduled Meds: . alvimopan  12 mg Oral BID  . enoxaparin (LOVENOX) injection  40 mg Subcutaneous Q24H  . famotidine (PEPCID) IV  20 mg Intravenous Q12H  . hydrocortisone sod succinate (SOLU-CORTEF) inj  50 mg Intravenous Q24H   . ketorolac  15 mg Intravenous Q8H   Continuous Infusions: . dextrose 5% lactated ringers with KCl 20 mEq/L 75 mL/hr at 10/04/15 0745  . lactated ringers with kcl 125 mL/hr at 10/02/15 1407   PRN Meds:.HYDROcodone-acetaminophen, HYDROmorphone (DILAUDID) injection, methocarbamol, morphine injection, ondansetron **OR** ondansetron (ZOFRAN) IV, promethazine  Assessment/Plan: Patient Active Problem List   Diagnosis Date Noted  . Abdominal pain 10/01/2015  . SBO (small bowel obstruction) (HCC) 08/28/2015  . Generalized abdominal pain 08/28/2015  . RLQ abdominal pain 08/28/2015  . Nausea and vomiting 08/28/2015  . Enteritis 08/28/2015   s/p Procedure(s): LAPAROSCOPIC SMALL BOWEL RESECTION  10/02/2015 -reorder toradol for 2 more days -advance to fulls -continue to ambulate -follow up pathology   LOS: 3 days   Rodman Pickle, MD Pg# [phone] [facility]

## 2015-10-04 NOTE — Progress Notes (Signed)
PROGRESS NOTE  Luis Pineda PFX:[id number] DOB: 1980-06-23 DOA: 10/01/2015 PCP: Thora Lance, MD   LOS: 3 days   Brief Narrative: 35 y.o. male with history of sbo thought due to inflammatory bowel disease, has been on steroids, however, his symptom persisted, he presented to [facility] ED a CT abdomen is done, showed resolution of previously seen sbo, but persistent changes in the mid and distal small bowel which is concerning for IBD. GI and Surgery consulted.  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 Keep on IV until able to take consistent po intake - s/p OR 7/14 - slow steroid taper post op as he has been on prednisone as an outpatient for few weeks per GI - advance diet per general surgery    DVT prophylaxis: Lovenox Code Status: Full Family Communication: wife bedside Disposition Plan: TBD  Consultants:   General surgery   GI  Procedures:   Ex lap with small bowel and inflammatory mass resection 7/14  Antimicrobials:  None    Subjective: - no chest pain, shortness of breath, no nausea or vomiting.  - abdomen is sore  Objective: Filed Vitals:   10/03/15 0649 10/03/15 1430 10/03/15 2209 10/04/15 0604  BP: 135/88 153/91 142/90 133/90  Pulse: 49 46 49 49  Temp: 98.1 F (36.7 C) 98.7 F (37.1 C) 97.6 F (36.4 C) 97.6 F (36.4 C)  TempSrc: Oral Axillary Axillary Oral  Resp: 14 16 17 17   Height:      Weight:      SpO2: 98% 99% 99% 99%    Intake/Output Summary (Last 24 hours) at 10/04/15 1122 Last data filed at 10/04/15 0811  Gross per 24 hour  Intake   2880 ml  Output    900 ml  Net   1980 ml   Filed Weights   10/01/15 0943  Weight: 93.441 kg (206 lb)     Examination: Constitutional: NAD Filed Vitals:   10/03/15 0649 10/03/15 1430 10/03/15 2209 10/04/15 0604  BP: 135/88 153/91 142/90 133/90  Pulse: 49 46 49 49  Temp: 98.1 F (36.7 C) 98.7 F (37.1 C) 97.6 F (36.4 C) 97.6 F (36.4 C)  TempSrc: Oral Axillary Axillary Oral  Resp: 14 16 17 17   Height:      Weight:      SpO2: 98% 99% 99% 99%   Respiratory: clear to auscultation bilaterally, no wheezing, no crackles.  Cardiovascular: Regular rate and rhythm, no murmurs / rubs / gallops. No LE edema. 2+ pedal pulses.  Abdomen: Bowel sounds positive. Dressing with minimal amount of blood  Data Reviewed: I have personally reviewed following labs and imaging studies  CBC:  Recent Labs Lab 10/01/15 0901 10/01/15 1012 10/02/15 0529 10/03/15 0515 10/04/15 0518  WBC 10.3 11.2* 10.1 13.2* 6.5  NEUTROABS 7.4  --   --   --   --   HGB 14.4 14.8 14.6 13.4 12.1*  HCT 42.0 44.4 43.0 40.4 37.2*  MCV 85.9 88.6 86.7 87.3 89.9  PLT 219.0 223 204 225 199   Basic Metabolic Panel:  Recent Labs Lab 10/01/15 1005 10/02/15 0529 10/03/15 0515 10/04/15 0518  NA 139 138 138 139  K 3.6 4.4 4.1 3.9  CL 104 104 103 103  CO2 29 26  28 32  GLUCOSE 106* 137* 126* 101*  BUN CREATININE 1.06 0.91 1.00 0.92  CALCIUM 9.1 9.2 8.7* 8.6*  MG  --  2.0  --   --    GFR: Estimated Creatinine Clearance: 126.3 mL/min (by C-G formula based on Cr of 0.92). Liver Function Tests:  Recent Labs Lab 10/01/15 1005  AST 13*  ALT 26  ALKPHOS 56  BILITOT 1.3*  PROT 7.0  ALBUMIN 4.2    Recent Labs Lab 10/01/15 1005  LIPASE 26   No results for input(s): AMMONIA in the last 168 hours. Coagulation Profile:  Recent Labs Lab 10/02/15 0529  INR 1.16   Urine analysis:    Component Value Date/Time   COLORURINE YELLOW 10/01/2015 1000   APPEARANCEUR CLEAR 10/01/2015 1000   LABSPEC 1.020 10/01/2015 1000   PHURINE 6.0 10/01/2015 1000   GLUCOSEU NEGATIVE 10/01/2015 1000   HGBUR NEGATIVE  10/01/2015 1000   BILIRUBINUR NEGATIVE 10/01/2015 1000   KETONESUR NEGATIVE 10/01/2015 1000   PROTEINUR NEGATIVE 10/01/2015 1000   NITRITE NEGATIVE 10/01/2015 1000   LEUKOCYTESUR NEGATIVE 10/01/2015 1000   Sepsis Labs: Invalid input(s): PROCALCITONIN, LACTICIDVEN  No results found for this or any previous visit (from the past 240 hour(s)).   Radiology Studies: No results found. Scheduled Meds: . alvimopan  12 mg Oral BID  . enoxaparin (LOVENOX) injection  40 mg Subcutaneous Q24H  . famotidine (PEPCID) IV  20 mg Intravenous Q12H  . hydrocortisone sod succinate (SOLU-CORTEF) inj  50 mg Intravenous Q24H  . ketorolac  15 mg Intravenous Q8H   Continuous Infusions: . dextrose 5% lactated ringers with KCl 20 mEq/L 75 mL/hr at 10/04/15 0745  . lactated ringers with kcl 125 mL/hr at 10/02/15 1407   Pamella Pert, MD, PhD [facility] Pager [phone]  If 7PM-7AM, please contact night-coverage [email or web address] Password [email or web address] 10/04/2015, 11:22 AM

## 2015-10-05 MED ORDER — PROMETHAZINE HCL 25 MG/ML IJ SOLN
6.2500 mg | Freq: Four times a day (QID) | INTRAMUSCULAR | Status: DC | PRN
  Administered 2015-10-05: 12.5 mg via INTRAVENOUS
  Filled 2015-10-05 (×2): qty 1

## 2015-10-05 MED ORDER — HYDROCORTISONE NA SUCCINATE PF 100 MG IJ SOLR
25.0000 mg | INTRAMUSCULAR | Status: DC
  Administered 2015-10-05: 25 mg via INTRAVENOUS
  Filled 2015-10-05 (×2): qty 0.5

## 2015-10-05 NOTE — Progress Notes (Signed)
3 Days Post-Op  Subjective: He feels terrible this AM, nausea not really better with Zofran.  Getting Toradol and Dilaudid for pain.  Coming down on dilaudid.  Also getting hydrocodone.  BS hyperactive this AM.  He seems really depressed this AM, feels worse today than he did over the weekend.  Still on Solucortef at 50 mg daily.    Objective: Vital signs in last 24 hours: Temp:  [98 F (36.7 C)-98.2 F (36.8 C)] 98.1 F (36.7 C) (07/17 0438) Pulse Rate:  [54-71] 54 (07/17 0438) Resp:  [18-19] 19 (07/17 0438) BP: (134-147)/(85-90) 144/90 mmHg (07/17 0438) SpO2:  [97 %-99 %] 98 % (07/17 0438) Last BM Date: 10/01/15 1080 PO Urine 1000 Stool x 1 this AM  Afebrile, VSS  BP is up No labs this AM Intake/Output from previous day: 07/16 0701 - 07/17 0700 In: 3980 [P.O.:1080; I.V.:2900] Out: 1000 [Urine:1000] Intake/Output this shift:    General appearance: alert, cooperative and looks and feels bad this AM. Resp: clear to auscultation bilaterally and not moving much air in the bases GI: soft, BS are hyperactive, I pulled out the wicks fromt he wound and redressed.  + small BM this AM.  Lab Results:   Recent Labs  10/03/15 0515 10/04/15 0518  WBC 13.2* 6.5  HGB 13.4 12.1*  HCT 40.4 37.2*  PLT 225 199    BMET  Recent Labs  10/03/15 0515 10/04/15 0518  NA 138 139  K 4.1 3.9  CL 103 103  CO2 28 32  GLUCOSE 126* 101*  BUN 8 6  CREATININE 1.00 0.92  CALCIUM 8.7* 8.6*   PT/INR No results for input(s): LABPROT, INR in the last 72 hours.   Recent Labs Lab 10/01/15 1005  AST 13*  ALT 26  ALKPHOS 56  BILITOT 1.3*  PROT 7.0  ALBUMIN 4.2     Lipase     Component Value Date/Time   LIPASE 26 10/01/2015 1005     Studies/Results: No results found.  Medications: . enoxaparin (LOVENOX) injection  40 mg Subcutaneous Q24H  . famotidine (PEPCID) IV  20 mg Intravenous Q12H  . hydrocortisone sod succinate (SOLU-CORTEF) inj  50 mg Intravenous Q24H  . ketorolac  15  mg Intravenous Q8H   . dextrose 5% lactated ringers with KCl 20 mEq/L 75 mL/hr at 10/05/15 1000  . 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 Derrell Lolling  POD 3 Possible Crohn's disease  FEN: full liquids/IV fluids ID:Pre op only DVT: Lovenox    Plan:  I will give him some phenergan, I told him to go back to clears, but I will leave it at full liquids in the computer for now.      LOS: 4 days    Normon Pettijohn 10/05/2015 [phone]

## 2015-10-05 NOTE — Progress Notes (Signed)
PROGRESS NOTE  Luis Pineda YQM:[id number] DOB: 09/01/1980 DOA: 10/01/2015 PCP: Thora Lance, MD   LOS: 4 days   Brief Narrative: 35 y.o. male with history of sbo thought due to inflammatory bowel disease, has been on steroids, however, his symptom persisted, he presented to [facility] ED a CT abdomen is done, showed resolution of previously seen sbo, but persistent changes in the mid and distal small bowel which is concerning for IBD. GI and Surgery consulted.  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 Keep on IV until able to take consistent po intake - s/p OR 7/14 - slow steroid taper post op as he has been on prednisone as an outpatient for few weeks per GI. Decrease to 25 mg today - advance diet per general surgery    DVT prophylaxis: Lovenox Code Status: Full Family Communication: wife bedside Disposition Plan: TBD  Consultants:   General surgery   GI  Procedures:   Ex lap with small bowel and inflammatory mass resection 7/14  Antimicrobials:  None    Subjective: - he is feeling worse today. Has had nausea after eating breakfast  Objective: Filed Vitals:   10/04/15 0604 10/04/15 1316 10/04/15 2138 10/05/15 0438  BP: 133/90 147/89 134/85 144/90  Pulse: 49 71 61 54  Temp: 97.6 F (36.4 C) 98.2 F (36.8 C) 98 F (36.7 C) 98.1 F (36.7 C)  TempSrc: Oral Oral Oral Oral  Resp: Height:      Weight:      SpO2: 99% 99% 97% 98%    Intake/Output Summary (Last 24 hours) at 10/05/15 1252 Last data filed at 10/05/15 0600  Gross per 24 hour  Intake   2900 ml  Output   1000 ml  Net   1900 ml   Filed Weights   10/01/15 0943  Weight: 93.441 kg (206 lb)     Examination: Constitutional: NAD Filed Vitals:   10/04/15 0604 10/04/15 1316 10/04/15 2138 10/05/15 0438  BP: 133/90 147/89 134/85 144/90  Pulse: 49 71 61 54  Temp: 97.6 F (36.4 C) 98.2 F (36.8 C) 98 F (36.7 C) 98.1 F (36.7 C)  TempSrc: Oral Oral Oral Oral  Resp: Height:      Weight:      SpO2: 99% 99% 97% 98%   Respiratory: clear to auscultation bilaterally, no wheezing, no crackles.  Cardiovascular: Regular rate and rhythm, no murmurs / rubs / gallops. No LE edema. 2+ pedal pulses.  Abdomen: Bowel sounds positive. Dressing with minimal amount of blood  Data Reviewed: I have personally reviewed following labs and imaging studies  CBC:  Recent Labs Lab 10/01/15 0901 10/01/15 1012 10/02/15 0529 10/03/15 0515 10/04/15 0518  WBC 10.3 11.2* 10.1 13.2* 6.5  NEUTROABS 7.4  --   --   --   --   HGB 14.4 14.8 14.6 13.4 12.1*  HCT 42.0 44.4 43.0 40.4 37.2*  MCV 85.9 88.6 86.7 87.3 89.9  PLT 219.0 223 204 225 199   Basic Metabolic Panel:  Recent Labs Lab 10/01/15 1005 10/02/15 0529 10/03/15 0515 10/04/15 0518  NA 139 138 138 139  K 3.6 4.4 4.1 3.9  CL 104 104 103 103  CO2 29 26  28 32  GLUCOSE 106* 137* 126* 101*  BUN 8 9 8 6   CREATININE 1.06 0.91 1.00 0.92  CALCIUM 9.1 9.2 8.7* 8.6*  MG  --  2.0  --   --    GFR: Estimated Creatinine Clearance: 126.3 mL/min (by C-G formula based on Cr of 0.92). Liver Function Tests:  Recent Labs Lab 10/01/15 1005  AST 13*  ALT 26  ALKPHOS 56  BILITOT 1.3*  PROT 7.0  ALBUMIN 4.2    Recent Labs Lab 10/01/15 1005  LIPASE 26   No results for input(s): AMMONIA in the last 168 hours. Coagulation Profile:  Recent Labs Lab 10/02/15 0529  INR 1.16   Urine analysis:    Component Value Date/Time   COLORURINE YELLOW 10/01/2015 1000   APPEARANCEUR CLEAR 10/01/2015 1000   LABSPEC 1.020 10/01/2015 1000   PHURINE 6.0 10/01/2015 1000   GLUCOSEU NEGATIVE 10/01/2015 1000   HGBUR NEGATIVE 10/01/2015  1000   BILIRUBINUR NEGATIVE 10/01/2015 1000   KETONESUR NEGATIVE 10/01/2015 1000   PROTEINUR NEGATIVE 10/01/2015 1000   NITRITE NEGATIVE 10/01/2015 1000   LEUKOCYTESUR NEGATIVE 10/01/2015 1000    Radiology Studies: No results found. Scheduled Meds: . enoxaparin (LOVENOX) injection  40 mg Subcutaneous Q24H  . famotidine (PEPCID) IV  20 mg Intravenous Q12H  . hydrocortisone sod succinate (SOLU-CORTEF) inj  50 mg Intravenous Q24H  . ketorolac  15 mg Intravenous Q8H   Continuous Infusions: . dextrose 5% lactated ringers with KCl 20 mEq/L 100 mL/hr at 10/05/15 1234   Pamella Pert, MD, PhD [facility] Pager [phone]  If 7PM-7AM, please contact night-coverage [email or web address] Password [facility] 10/05/2015, 12:52 PM

## 2015-10-05 NOTE — Progress Notes (Signed)
Patient had a bowel movement.  Order for Entereg discontinued, since patient has had a bowel movement.

## 2015-10-06 LAB — COMPREHENSIVE METABOLIC PANEL
ALT: 24 U/L (ref 17–63)
ANION GAP: 4 — AB (ref 5–15)
AST: 13 U/L — AB (ref 15–41)
Albumin: 3.2 g/dL — ABNORMAL LOW (ref 3.5–5.0)
Alkaline Phosphatase: 43 U/L (ref 38–126)
BILIRUBIN TOTAL: 0.3 mg/dL (ref 0.3–1.2)
BUN: 10 mg/dL (ref 6–20)
CO2: 32 mmol/L (ref 22–32)
Calcium: 8.7 mg/dL — ABNORMAL LOW (ref 8.9–10.3)
Chloride: 102 mmol/L (ref 101–111)
Creatinine, Ser: 1.09 mg/dL (ref 0.61–1.24)
Glucose, Bld: 102 mg/dL — ABNORMAL HIGH (ref 65–99)
POTASSIUM: 3.5 mmol/L (ref 3.5–5.1)
Sodium: 138 mmol/L (ref 135–145)
TOTAL PROTEIN: 5.8 g/dL — AB (ref 6.5–8.1)

## 2015-10-06 LAB — CBC
HEMATOCRIT: 36.9 % — AB (ref 39.0–52.0)
Hemoglobin: 12.3 g/dL — ABNORMAL LOW (ref 13.0–17.0)
MCH: 29.3 pg (ref 26.0–34.0)
MCHC: 33.3 g/dL (ref 30.0–36.0)
MCV: 87.9 fL (ref 78.0–100.0)
Platelets: 210 10*3/uL (ref 150–400)
RBC: 4.2 MIL/uL — ABNORMAL LOW (ref 4.22–5.81)
RDW: 12.9 % (ref 11.5–15.5)
WBC: 5.3 10*3/uL (ref 4.0–10.5)

## 2015-10-06 MED ORDER — FAMOTIDINE 20 MG PO TABS
20.0000 mg | ORAL_TABLET | Freq: Two times a day (BID) | ORAL | Status: DC
  Administered 2015-10-06 – 2015-10-07 (×2): 20 mg via ORAL
  Filled 2015-10-06 (×2): qty 1

## 2015-10-06 MED ORDER — PREDNISONE 10 MG PO TABS
10.0000 mg | ORAL_TABLET | Freq: Two times a day (BID) | ORAL | Status: DC
  Administered 2015-10-06 – 2015-10-07 (×2): 10 mg via ORAL
  Filled 2015-10-06 (×2): qty 1

## 2015-10-06 NOTE — Progress Notes (Signed)
Nutrition Follow-up  DOCUMENTATION CODES:   Not applicable  INTERVENTION:  -RD to continue to monitor  NUTRITION DIAGNOSIS:   Inadequate oral intake related to poor appetite, nausea, other (see comment) (diarrhea) as evidenced by per patient/family report. -resolving  GOAL:   Patient will meet greater than or equal to 90% of their needs -progressing  MONITOR:   PO intake, Skin, I & O's, Labs, Supplement acceptance  REASON FOR ASSESSMENT:   Malnutrition Screening Tool    ASSESSMENT:   Luis Pineda is a 35 yo man who presented with h/o sbo thought due to inflammatory bowel disease has been on steroids, however, his symptom persisted,he presented to [facility] ED a CT abdomen is done, showed resolution of previously seen sbo, but persistent changes in teh mid and distal small bowel which is concerning for IBD, he was given prn pain meds, gi consulted, hospitalist called to admit the patient.  [name] is doing ok appetite wise today. Did not have breakfast, had an omelet for lunch he consumed most of. Documented PO 85-100% for the past few days. No nausea today but he is having some diarrhea. Ran to the bathroom during my visit. 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  Labs and medications reviewed: D5 Ringers with KCL 38mEq/L: 204 calories  Diet Order:  DIET SOFT Room service appropriate?: Yes; Fluid consistency:: Thin  Skin:  Wound (see comment) (Closed wound to abdomen)  Last BM:  7/13  Height:   Ht Readings from Last 1 Encounters:  10/01/15  (1.778 m)    Weight:   Wt Readings from Last 1 Encounters:  10/01/15 206 lb (93.441 kg)    Ideal Body Weight:  75.45 kg  BMI:  Body mass index is 29.56 kg/(m^2).  Estimated Nutritional Needs:   Kcal:  1900-2400  Protein:  93-110 grams  Fluid:  >/= 1.9L  EDUCATION NEEDS:   No education needs identified at this time  Dionne Ano. Lindzie Boxx, MS, RD LDN Inpatient Clinical  Dietitian Pager [phone]

## 2015-10-06 NOTE — Progress Notes (Signed)
Pathology:  1. 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 Small intestine, resection, distal segment of ileum SMALL BOWEL MUCOSA NO EVIDENCE OF MALIGNANCY 3. Small intestine, resection, proximal segment SMALL BOWEL MUCOSA NO EVIDENCE OF MALIGNANCY Zhaoli  i will talk with Medicine and start the taper of steroids.

## 2015-10-06 NOTE — Progress Notes (Signed)
4 Days Post-Op  Subjective: He is better this AM, had a BM, the nausea is better.  Sites look fine.  Still anxious about pathology.    Objective: Vital signs in last 24 hours: Temp:  [97.8 F (36.6 C)-98.3 F (36.8 C)] 98 F (36.7 C) (07/18 0655) Pulse Rate:  [63-66] 63 (07/18 0655) Resp:  [18] 18 (07/18 0655) BP: (139-154)/(76-89) 154/76 mmHg (07/18 0655) SpO2:  [95 %-97 %] 97 % (07/18 0655) Last BM Date: 10/05/15  1258 IV BM x 1  Afebrile, VSS Labs ok  Intake/Output from previous day: 07/17 0701 - 07/18 0700 In: 1285.8 [I.V.:1285.8] Out: -  Intake/Output this shift:    General appearance: alert, cooperative and no distress Resp: clear to auscultation bilaterally GI: soft sore, expecially RLQ, says it feels like a knot there.  I don't feel it.  + BS, +BM, nausea is better.  Sites and suture like looks good.    Lab Results:   Recent Labs  10/04/15 0518 10/06/15 0519  WBC 6.5 5.3  HGB 12.1* 12.3*  HCT 37.2* 36.9*  PLT 199 210    BMET  Recent Labs  10/04/15 0518 10/06/15 0519  NA 139 138  K 3.9 3.5  CL 103 102  CO2 32 32  GLUCOSE 101* 102*  BUN 6 10  CREATININE 0.92 1.09  CALCIUM 8.6* 8.7*   PT/INR No results for input(s): LABPROT, INR in the last 72 hours.   Recent Labs Lab 10/01/15 1005 10/06/15 0519  AST 13* 13*  ALT 26 24  ALKPHOS 56 43  BILITOT 1.3* 0.3  PROT 7.0 5.8*  ALBUMIN 4.2 3.2*     Lipase     Component Value Date/Time   LIPASE 26 10/01/2015 1005     Studies/Results: No results found.  Medications: . enoxaparin (LOVENOX) injection  40 mg Subcutaneous Q24H  . famotidine (PEPCID) IV  20 mg Intravenous Q12H  . hydrocortisone sod succinate (SOLU-CORTEF) inj  25 mg Intravenous Q24H   . 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 Derrell Lolling  POD 4 Possible Crohn's disease  FEN: full liquids/IV fluids ID:Pre op only DVT: Lovenox    Plan:  He is going to advance his diet, decrease IV fluids, he ask about the sutures and he is going to the beach next week.  Plan to get them out when he returns.  I told him to go home he has to be eating soft diet, ambulating well, having bowel movements and tolerating pain with just oral pain medicines.  Path is still pending.  LOS: 5 days    Marsha Hillman 10/06/2015 [phone]

## 2015-10-06 NOTE — Progress Notes (Addendum)
PROGRESS NOTE  Luis Pineda:[id number] DOB: 10-19-1980 DOA: 10/01/2015 PCP: Thora Lance, MD   LOS: 5 days   Brief Narrative: 35 y.o. male with history of sbo thought due to inflammatory bowel disease, has been on steroids, however, his symptom persisted, he presented to [facility] ED a CT abdomen is done, showed resolution of previously seen sbo, but persistent changes in the mid and distal small bowel which is concerning for IBD. GI and Surgery consulted.  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  - s/p OR 7/14 - slow steroid taper post op as he has been on prednisone as an outpatient for few weeks per GI. Decrease to 10 BID prednisone today  - advance diet per general surgery    DVT prophylaxis: Lovenox Code Status: Full Family Communication: wife bedside Disposition Plan: TBD  Consultants:   General surgery   GI  Procedures:   Ex lap with small bowel and inflammatory mass resection 7/14  Antimicrobials:  None    Subjective: - a bit better today  Objective: Filed Vitals:   10/05/15 0438 10/05/15 1749 10/05/15 2117 10/06/15 0655  BP: 144/90 139/87 146/89 154/76  Pulse: 54 66 65 63  Temp: 98.1 F (36.7 C) 97.8 F (36.6 C) 98.3 F (36.8 C) 98 F (36.7 C)  TempSrc: Oral Oral Oral Oral  Resp: 19 18 18 18   Height:      Weight:      SpO2: 98% 96% 95% 97%    Intake/Output Summary (Last 24 hours) at 10/06/15 1247 Last data filed at 10/05/15 1900  Gross per 24 hour  Intake 1285.83 ml  Output      0 ml  Net 1285.83 ml   Filed Weights   10/01/15 0943  Weight: 93.441 kg (206 lb)    Examination: Constitutional: NAD Filed Vitals:   10/05/15 0438 10/05/15 1749 10/05/15 2117  10/06/15 0655  BP: 144/90 139/87 146/89 154/76  Pulse: 54 66 65 63  Temp: 98.1 F (36.7 C) 97.8 F (36.6 C) 98.3 F (36.8 C) 98 F (36.7 C)  TempSrc: Oral Oral Oral Oral  Resp: 19 18 18 18   Height:      Weight:      SpO2: 98% 96% 95% 97%   Respiratory: clear to auscultation bilaterally, no wheezing, no crackles.  Cardiovascular: Regular rate and rhythm, no murmurs / rubs / gallops. No LE edema. 2+ pedal pulses.  Abdomen: Bowel sounds positive. Dressing clear now  Data Reviewed: I have personally reviewed following labs and imaging studies  CBC:  Recent Labs Lab 10/01/15 0901 10/01/15 1012 10/02/15 0529 10/03/15 0515 10/04/15 0518 10/06/15 0519  WBC 10.3 11.2* 10.1 13.2* 6.5 5.3  NEUTROABS 7.4  --   --   --   --   --   HGB 14.4 14.8 14.6 13.4 12.1* 12.3*  HCT 42.0 44.4 43.0 40.4 37.2* 36.9*  MCV 85.9 88.6 86.7 87.3 89.9 87.9  PLT 219.0 223 204 225 199 210   Basic Metabolic Panel:  Recent Labs Lab 10/01/15 1005 10/02/15 0529 10/03/15 0515 10/04/15 0518 10/06/15 0519  NA 139 138 138 139 138  K 3.6 4.4 4.1 3.9 3.5  CL 104 104 103 103 102  CO2 29 26 28  32 32  GLUCOSE 106* 137* 126* 101* 102*  BUN CREATININE 1.06 0.91 1.00 0.92 1.09  CALCIUM 9.1 9.2 8.7* 8.6* 8.7*  MG  --  2.0  --   --   --    GFR: Estimated Creatinine Clearance: 106.6 mL/min (by C-G formula based on Cr of 1.09). Liver Function Tests:  Recent Labs Lab 10/01/15 1005 10/06/15 0519  AST 13* 13*  ALT 26 24  ALKPHOS 56 43  BILITOT 1.3* 0.3  PROT 7.0 5.8*  ALBUMIN 4.2 3.2*    Recent Labs Lab 10/01/15 1005  LIPASE 26   No results for input(s): AMMONIA in the last 168 hours. Coagulation Profile:  Recent Labs Lab 10/02/15 0529  INR 1.16   Urine analysis:    Component Value Date/Time   COLORURINE YELLOW 10/01/2015 1000   APPEARANCEUR CLEAR 10/01/2015 1000   LABSPEC 1.020 10/01/2015 1000   PHURINE 6.0 10/01/2015 1000   GLUCOSEU NEGATIVE 10/01/2015 1000   HGBUR  NEGATIVE 10/01/2015 1000   BILIRUBINUR NEGATIVE 10/01/2015 1000   KETONESUR NEGATIVE 10/01/2015 1000   PROTEINUR NEGATIVE 10/01/2015 1000   NITRITE NEGATIVE 10/01/2015 1000   LEUKOCYTESUR NEGATIVE 10/01/2015 1000    Radiology Studies: No results found. Scheduled Meds: . enoxaparin (LOVENOX) injection  40 mg Subcutaneous Q24H  . famotidine (PEPCID) IV  20 mg Intravenous Q12H  . hydrocortisone sod succinate (SOLU-CORTEF) inj  25 mg Intravenous Q24H   Continuous Infusions: . dextrose 5% lactated ringers with KCl 20 mEq/L 50 mL/hr at 10/06/15 1059   Pamella Pert, MD, PhD [facility] Pager [phone]  If 7PM-7AM, please contact night-coverage [email or web address] Password [facility] 10/06/2015, 12:47 PM

## 2015-10-07 ENCOUNTER — Telehealth: Payer: Self-pay | Admitting: Gastroenterology

## 2015-10-07 ENCOUNTER — Encounter (HOSPITAL_COMMUNITY): Payer: Self-pay | Admitting: General Surgery

## 2015-10-07 ENCOUNTER — Encounter: Payer: Self-pay | Admitting: General Surgery

## 2015-10-07 DIAGNOSIS — Q430 Meckel's diverticulum (displaced) (hypertrophic): Secondary | ICD-10-CM

## 2015-10-07 HISTORY — DX: Meckel's diverticulum (displaced) (hypertrophic): Q43.0

## 2015-10-07 MED ORDER — IBUPROFEN 200 MG PO TABS: 600.0000 mg | ORAL_TABLET | Freq: Four times a day (QID) | ORAL | Status: DC | PRN

## 2015-10-07 MED ORDER — PREDNISONE 5 MG PO TABS: ORAL_TABLET | ORAL | Status: DC

## 2015-10-07 MED ORDER — ACETAMINOPHEN 325 MG PO TABS: ORAL_TABLET | ORAL | Status: AC

## 2015-10-07 MED ORDER — IBUPROFEN 200 MG PO TABS: ORAL_TABLET | ORAL | Status: AC

## 2015-10-07 MED ORDER — HYDROCODONE-ACETAMINOPHEN 5-325 MG PO TABS: 1.0000 | ORAL_TABLET | ORAL | Status: AC | PRN

## 2015-10-07 NOTE — Telephone Encounter (Signed)
Encounter opened in error

## 2015-10-07 NOTE — Progress Notes (Signed)
Daily Rounding Note  10/07/2015, 1:32 PM  LOS: 6 days   SUBJECTIVE:   Asked to finalize future GI plans for this pt.  Chief complaint at time of admission: abdominal pain.       Please see initial consult from 7/13.  Pt had been suspected to have distal ileal Crohn's though endoscopic investigations failed to reach the area in question.  Admitted with progressive pain, persistent distal SB phlegmon.   S/p 7/14 laparoscopic converted to open SB resection with primary anastomosis.  Dr Derrell Lolling noted distal ileal inflammatory mass densely adhesed to retroperitoneum.  He removed 1.5 feet of SB.  Pathology showed abscessed/inflamed Meckel's diverticulum and no evidence for Crohn's.     Dr Myrtie Neither rec post surgery was to wean pt off steroids.  Currently his dose is 20 mg daily,  Was taking 25 mg PTA.   Weaning schedule is:  10 mg twice a day for 5 days, 10 mg daily for 5 days, 5 mg daily for 5 days and then discontinue   Pt is tolerating solid food as of this morning.  Loose and frequent stools resumed yesterday.  Has not been receiving abx.  Last pain med use was Toradol on 7/17.  Plans in place for discharge this afternoon. General surgery wondering if pt needs to return for GI follow up.    OBJECTIVE:         Vital signs in last 24 hours:    Temp:  [98.1 F (36.7 C)-98.7 F (37.1 C)] 98.1 F (36.7 C) (07/19 0647) Pulse Rate:  [55-70] 55 (07/19 0647) Resp:  [17-18] 17 (07/19 0647) BP: (120-139)/(68-76) 128/68 mmHg (07/19 0647) SpO2:  [96 %-97 %] 96 % (07/19 0647) Last BM Date: 10/07/15 Filed Weights   10/01/15 0943  Weight: 93.441 kg (206 lb)   General: pleasant, looks well.  comfortable   Heart: RRR Chest: clear bil.  No labored breathing or cough Abdomen: did not palpate but surgical incisions look healthy: clean/dry/intact, surgical stable at laparotomy incision.    Extremities: no CCE Neuro/Psych:  Pleasant, relaxed,   Alert and oriented x 3.    Intake/Output from previous day: 07/18 0701 - 07/19 0700 In: 440 [P.O.:440] Out: -   Intake/Output this shift:    Lab Results:  Recent Labs  10/06/15 0519  WBC 5.3  HGB 12.3*  HCT 36.9*  PLT 210   BMET  Recent Labs  10/06/15 0519  NA 138  K 3.5  CL 102  CO2 32  GLUCOSE 102*  BUN 10  CREATININE 1.09  CALCIUM 8.7*   LFT  Recent Labs  10/06/15 0519  PROT 5.8*  ALBUMIN 3.2*  AST 13*  ALT 24  ALKPHOS 43  BILITOT 0.3   PT/INR No results for input(s): LABPROT, INR in the last 72 hours. Hepatitis Panel No results for input(s): HEPBSAG, HCVAB, HEPAIGM, HEPBIGM in the last 72 hours.  Studies/Results: No results found.  ASSESMENT:   *  Meckel's diverticulum with diverticulitis, abscess formation.  S/p laparotomy with SB resection and primary anastomosis.  In the weeks since initial presentation there was concern for SB Crohn's.  Pathology does not confirm dx of Crohn's.     PLAN   *  No need to have pt follow up with Dr Adela Lank.  Will cc this note to Dr Adela Lank so he is aware of the outcome.  Appreciate Dr Jacinto Halim and the surgical team's excellent and prompt care of this pt.    *  Steroid taper as planned.    Jennye Moccasin  10/07/2015, 1:32 PM Pager: [phone]  ________________________________________________________________________  Corinda Gubler GI MD note:  I  reviewed the data and agree with the assessment and plan described above.  He was discharged before I could get in to see him this afternoon.   Rob Bunting, MD [facility] Pager [phone]

## 2015-10-07 NOTE — Discharge Instructions (Signed)
[facility]      [facility] °[phone] ° °OPEN ABDOMINAL SURGERY: POST OP INSTRUCTIONS ° °Always review your discharge instruction sheet given to you by the facility where your surgery was performed. ° °IF YOU HAVE DISABILITY OR FAMILY LEAVE FORMS, YOU MUST BRING THEM TO THE OFFICE FOR PROCESSING.  PLEASE DO NOT GIVE THEM TO YOUR DOCTOR. ° °1. A prescription for pain medication may be given to you upon discharge.  Take your pain medication as prescribed, if needed.  If narcotic pain medicine is not needed, then you may take acetaminophen (Tylenol) or ibuprofen (Advil) as needed. °2. Take your usually prescribed medications unless otherwise directed. °3. If you need a refill on your pain medication, please contact your pharmacy. They will contact our office to request authorization.  Prescriptions will not be filled after 5pm or on week-ends. °4. You should follow a light diet the first few days after arrival home, such as soup and crackers, pudding, etc.unless your doctor has advised otherwise. A high-fiber, low fat diet can be resumed as tolerated.   Be sure to include lots of fluids daily. Most patients will experience some swelling and bruising on the chest and neck area.  Ice packs will help.  Swelling and bruising can take several days to resolve °5. Most patients will experience some swelling and bruising in the area of the incision. Ice pack will help. Swelling and bruising can take several days to resolve..  °6. It is common to experience some constipation if taking pain medication after surgery.  Increasing fluid intake and taking a stool softener will usually help or prevent this problem from occurring.  A mild laxative (Milk of Magnesia or Miralax) should be taken according to package directions if there are no bowel movements after 48 hours. °7.  You may have steri-strips (small skin tapes) in place directly over the incision.  These strips should be left on the skin for 7-10 days.  If your  surgeon used skin glue on the incision, you may shower in 24 hours.  The glue will flake off over the next 2-3 weeks.  Any sutures or staples will be removed at the office during your follow-up visit. You may find that a light gauze bandage over your incision may keep your staples from being rubbed or pulled. You may shower and replace the bandage daily. °8. ACTIVITIES:  You may resume regular (light) daily activities beginning the next day--such as daily self-care, walking, climbing stairs--gradually increasing activities as tolerated.  You may have sexual intercourse when it is comfortable.  Refrain from any heavy lifting or straining until approved by your doctor. °a. You may drive when you no longer are taking prescription pain medication, you can comfortably wear a seatbelt, and you can safely maneuver your car and apply brakes °b. Return to Work: ___________________________________ °9. You should see your doctor in the office for a follow-up appointment approximately two weeks after your surgery.  Make sure that you call for this appointment within a day or two after you arrive home to insure a convenient appointment time. °OTHER INSTRUCTIONS:  °_____________________________________________________________ °_____________________________________________________________ ° °WHEN TO CALL YOUR DOCTOR: °1. Fever over 101.0 °2. Inability to urinate °3. Nausea and/or vomiting °4. Extreme swelling or bruising °5. Continued bleeding from incision. °6. Increased pain, redness, or drainage from the incision. °7. Difficulty swallowing or breathing °8. Muscle cramping or spasms. °9. Numbness or tingling in hands or feet or around lips. ° °The clinic staff is available to   answer your questions during regular business hours.  Please dont hesitate to call and ask to speak to one of the nurses if you have concerns.  For further questions, please visit [email or web address]  Low-Fiber Diet for about another  week Fiber is found in fruits, vegetables, and whole grains. A low-fiber diet restricts fibrous foods that are not digested in the small intestine. A diet containing about 10-15 grams of fiber per day is considered low fiber. Low-fiber diets may be used to:  Promote healing and rest the bowel during intestinal flare-ups.  Prevent blockage of a partially obstructed or narrowed gastrointestinal tract.  Reduce fecal weight and volume.  Slow the movement of feces. You may be on a low-fiber diet as a transitional diet following surgery, after an injury (trauma), or because of a short (acute) or lifelong (chronic) illness. Your health care provider will determine the length of time you need to stay on this diet.  WHAT DO I NEED TO KNOW ABOUT A LOW-FIBER DIET? Always check the fiber content on the packaging's Nutrition Facts label, especially on foods from the grains list. Ask your dietitian if you have questions about specific foods that are related to your condition, especially if the food is not listed below. In general, a low-fiber food will have less than 2 g of fiber. WHAT FOODS CAN I EAT? Grains All breads and crackers made with white flour. Sweet rolls, doughnuts, waffles, pancakes, [place] toast, bagels. Pretzels, Melba toast, zwieback. Well-cooked cereals, such as cornmeal, farina, or cream cereals. Dry cereals that do not contain whole grains, fruit, or nuts, such as refined corn, wheat, rice, and oat cereals. Potatoes prepared any way without skins, plain pastas and noodles, refined white rice. Use white flour for baking and making sauces. Use allowed list of grains for casseroles, dumplings, and puddings.  Vegetables Strained tomato and vegetable juices. Fresh lettuce, cucumber, spinach. Well-cooked (no skin or pulp) or canned vegetables, such as asparagus, bean sprouts, beets, carrots, green beans, mushrooms, potatoes, pumpkin, spinach, yellow squash, tomato sauce/puree, turnips, yams, and  zucchini. Keep servings limited to  cup.  Fruits All fruit juices except prune juice. Cooked or canned fruits without skin and seeds, such as applesauce, apricots, cherries, fruit cocktail, grapefruit, grapes, mandarin oranges, melons, peaches, pears, pineapple, and plums. Fresh fruits without skin, such as apricots, avocados, bananas, melons, pineapple, nectarines, and peaches. Keep servings limited to  cup or 1 piece.  Meat and Other Protein Sources Ground or well-cooked tender beef, ham, veal, lamb, pork, or poultry. Eggs, plain cheese. Fish, oysters, shrimp, lobster, and other seafood. Liver, organ meats. Smooth nut butters. Dairy All milk products and alternative dairy substitutes, such as soy, rice, almond, and coconut, not containing added whole nuts, seeds, or added fruit. Beverages Decaf coffee, fruit, and vegetable juices or smoothies (small amounts, with no pulp or skins, and with fruits from allowed list), sports drinks, herbal tea. Condiments Ketchup, mustard, vinegar, cream sauce, cheese sauce, cocoa powder. Spices in moderation, such as allspice, basil, bay leaves, celery powder or leaves, cinnamon, cumin powder, curry powder, ginger, mace, marjoram, onion or garlic powder, oregano, paprika, parsley flakes, ground pepper, rosemary, sage, savory, tarragon, thyme, and turmeric. Sweets and Desserts Plain cakes and cookies, pie made with allowed fruit, pudding, custard, cream pie. Gelatin, fruit, ice, sherbet, frozen ice pops. Ice cream, ice milk without nuts. Plain hard candy, honey, jelly, molasses, syrup, sugar, chocolate syrup, gumdrops, marshmallows. Limit overall sugar intake.  Fats and Oil Margarine, butter, cream, mayonnaise,  salad oils, plain salad dressings made from allowed foods. Choose healthy fats such as olive oil, canola oil, and omega-3 fatty acids (such as found in salmon or tuna) when possible.  Other Bouillon, broth, or cream soups made from allowed foods. Any  strained soup. Casseroles or mixed dishes made with allowed foods. The items listed above may not be a complete list of recommended foods or beverages. Contact your dietitian for more options.  WHAT FOODS ARE NOT RECOMMENDED? Grains All whole wheat and whole grain breads and crackers. Multigrains, rye, bran seeds, nuts, or coconut. Cereals containing whole grains, multigrains, bran, coconut, nuts, raisins. Cooked or dry oatmeal, steel-cut oats. Coarse wheat cereals, granola. Cereals advertised as high fiber. Potato skins. Whole grain pasta, wild or brown rice. Popcorn. Coconut flour. Bran, buckwheat, corn bread, multigrains, rye, wheat germ.  Vegetables Fresh, cooked or canned vegetables, such as artichokes, asparagus, beet greens, broccoli, Brussels sprouts, cabbage, celery, cauliflower, corn, eggplant, kale, legumes or beans, okra, peas, and tomatoes. Avoid large servings of any vegetables, especially raw vegetables.  Fruits Fresh fruits, such as apples with or without skin, berries, cherries, figs, grapes, grapefruit, guavas, kiwis, mangoes, oranges, papayas, pears, persimmons, pineapple, and pomegranate. Prune juice and juices with pulp, stewed or dried prunes. Dried fruits, dates, raisins. Fruit seeds or skins. Avoid large servings of all fresh fruits. Meats and Other Protein Sources Tough, fibrous meats with gristle. Chunky nut butter. Cheese made with seeds, nuts, or other foods not recommended. Nuts, seeds, legumes (beans, including baked beans), dried peas, beans, lentils.  Dairy Yogurt or cheese that contains nuts, seeds, or added fruit.  Beverages Fruit juices with high pulp, prune juice. Caffeinated coffee and teas.  Condiments Coconut, maple syrup, pickles, olives. Sweets and Desserts Desserts, cookies, or candies that contain nuts or coconut, chunky peanut butter, dried fruits. Jams, preserves with seeds, marmalade. Large amounts of sugar and sweets. Any other dessert made with fruits  from the not recommended list.  Other Soups made from vegetables that are not recommended or that contain other foods not recommended.  The items listed above may not be a complete list of foods and beverages to avoid. Contact your dietitian for more information.   This information is not intended to replace advice given to you by your health care provider. Make sure you discuss any questions you have with your health care provider.   Document Released: 08/27/2001 Document Revised: 03/12/2013 Document Reviewed: 01/28/2013 Elsevier Interactive Patient Education [employer]

## 2015-10-07 NOTE — Progress Notes (Signed)
Patient given discharge instructions, and verbalized an understanding of all discharge instructions.  Patient agrees with discharge plan, and is being discharged in stable medical condition.  

## 2015-10-07 NOTE — Progress Notes (Signed)
PROGRESS NOTE        PATIENT DETAILS Name: Luis Pineda Age: 35 y.o. Sex: male Date of Birth: Aug 06, 1980 Admit Date: 10/01/2015 Admitting Physician Albertine Grates, MD WUJ:WJXBJYN,WGNFAO R, MD   Brief Narrative: Patient is a 35 y.o. male who started to have intermittent bowel disease was on redness on prior to this admission for approximately 6 weeks, presented to the ED with worsening abdominal pain. He was thought to have small bowel obstruction, since it was not responding to standard medical therapy, Gen. surgery was consulted and patient underwent a laparotomy and partial small bowel resection. Biopsy shows Meckel's diverticulitis and abscess and no signs of Crohn's disease. Improving with supportive care, general surgery planning on discharge soon. Plans are to continue to taper steroids and discontinue.   Subjective: Feels better.  Assessment/Plan: Partial small bowel obstruction: Initially thought to have Crohn's disease-with no response to medical therapy-underwent exploratory laparotomy with partial small bowel resection. 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 Postoperative course has been relatively unremarkable, diet has been advanced. General surgery recommending that we continue to taper down prednisone, and if tolerates diet should be able to discharge either later today or tomorrow morning.  Would taper prednisone in the following manner: 10 mg twice a day for 5 days, 10 mg daily for 5 days, 5 mg daily for 5 days and then discontinue.  DVT Prophylaxis: Prophylactic Lovenox   Code Status: Full code   Family Communication: None at bedside  Disposition Plan: Remain inpatient-home later today or tomorrow morning.  Antimicrobial agents: None  Procedures:  Ex lap with small bowel and inflammatory mass resection 7/14  CONSULTS:  GI and general surgery  Time spent: 25  minutes-Greater than 50% of this time was spent in counseling, explanation of diagnosis, planning of further management, and coordination of care.  MEDICATIONS: Anti-infectives    Start     Dose/Rate Route Frequency Ordered Stop   10/02/15 2000  cefoTEtan (CEFOTAN) 2 g in dextrose 5 % 50 mL IVPB     2 g 100 mL/hr over 30 Minutes Intravenous Every 12 hours 10/02/15 1259 10/02/15 2044   10/02/15 0845  cefoTEtan (CEFOTAN) 2 g in dextrose 5 % 50 mL IVPB    Comments:  Infusion to begin in OR.   2 g 100 mL/hr over 30 Minutes Intravenous On call to O.R. 10/02/15 1308 10/02/15 0932      Scheduled Meds: . enoxaparin (LOVENOX) injection  40 mg Subcutaneous Q24H  . famotidine  20 mg Oral BID  . predniSONE  10 mg Oral BID WC   Continuous Infusions:  PRN Meds:.HYDROcodone-acetaminophen, HYDROmorphone (DILAUDID) injection, ibuprofen, methocarbamol, ondansetron **OR** ondansetron (ZOFRAN) IV, promethazine   PHYSICAL EXAM: Vital signs: Filed Vitals:   10/06/15 0655 10/06/15 1440 10/06/15 2107 10/07/15 0647  BP: 154/76 120/71 139/76 128/68  Pulse: 63 69 70 55  Temp: 98 F (36.7 C) 98.7 F (37.1 C) 98.1 F (36.7 C) 98.1 F (36.7 C)  TempSrc: Oral Oral Oral Oral  Resp: 18 18 17 17   Height:      Weight:      SpO2: 97% 97% 97% 96%   Filed Weights   10/01/15 0943  Weight: 93.441 kg (206 lb)   Body mass index is 29.56 kg/(m^2).   Gen Exam: Awake and alert  with clear speech. Not in any distress  Neck: Supple, No JVD.   Chest: B/L Clear.   CVS: S1 S2 Regular, no murmurs.  Abdomen: soft, BS +,Appropriately tender, dressing dry.    Extremities: no edema, lower extremities warm to touch. Neurologic: Non Focal.   Skin: No Rash or lesions  Wounds: N/A.    LABORATORY DATA: CBC:  Recent Labs Lab 10/01/15 0901 10/01/15 1012 10/02/15 0529 10/03/15 0515 10/04/15 0518 10/06/15 0519  WBC 10.3 11.2* 10.1 13.2* 6.5 5.3  NEUTROABS 7.4  --   --   --   --   --   HGB 14.4 14.8 14.6 13.4  12.1* 12.3*  HCT 42.0 44.4 43.0 40.4 37.2* 36.9*  MCV 85.9 88.6 86.7 87.3 89.9 87.9  PLT 219.0 223 204 225 199 210    Basic Metabolic Panel:  Recent Labs Lab 10/01/15 1005 10/02/15 0529 10/03/15 0515 10/04/15 0518 10/06/15 0519  NA 139 138 138 139 138  K 3.6 4.4 4.1 3.9 3.5  CL 104 104 103 103 102  CO2 29 26 28  32 32  GLUCOSE 106* 137* 126* 101* 102*  BUN 8 9 8 6 10   CREATININE 1.06 0.91 1.00 0.92 1.09  CALCIUM 9.1 9.2 8.7* 8.6* 8.7*  MG  --  2.0  --   --   --     GFR: Estimated Creatinine Clearance: 106.6 mL/min (by C-G formula based on Cr of 1.09).  Liver Function Tests:  Recent Labs Lab 10/01/15 1005 10/06/15 0519  AST 13* 13*  ALT 26 24  ALKPHOS 56 43  BILITOT 1.3* 0.3  PROT 7.0 5.8*  ALBUMIN 4.2 3.2*    Recent Labs Lab 10/01/15 1005  LIPASE 26   No results for input(s): AMMONIA in the last 168 hours.  Coagulation Profile:  Recent Labs Lab 10/02/15 0529  INR 1.16    Cardiac Enzymes: No results for input(s): CKTOTAL, CKMB, CKMBINDEX, TROPONINI in the last 168 hours.  BNP (last 3 results) No results for input(s): PROBNP in the last 8760 hours.  HbA1C: No results for input(s): HGBA1C in the last 72 hours.  CBG: No results for input(s): GLUCAP in the last 168 hours.  Lipid Profile: No results for input(s): CHOL, HDL, LDLCALC, TRIG, CHOLHDL, LDLDIRECT in the last 72 hours.  Thyroid Function Tests: No results for input(s): TSH, T4TOTAL, FREET4, T3FREE, THYROIDAB in the last 72 hours.  Anemia Panel: No results for input(s): VITAMINB12, FOLATE, FERRITIN, TIBC, IRON, RETICCTPCT in the last 72 hours.  Urine analysis:    Component Value Date/Time   COLORURINE YELLOW 10/01/2015 1000   APPEARANCEUR CLEAR 10/01/2015 1000   LABSPEC 1.020 10/01/2015 1000   PHURINE 6.0 10/01/2015 1000   GLUCOSEU NEGATIVE 10/01/2015 1000   HGBUR NEGATIVE 10/01/2015 1000   BILIRUBINUR NEGATIVE 10/01/2015 1000   KETONESUR NEGATIVE 10/01/2015 1000   PROTEINUR  NEGATIVE 10/01/2015 1000   NITRITE NEGATIVE 10/01/2015 1000   LEUKOCYTESUR NEGATIVE 10/01/2015 1000    Sepsis Labs: Lactic Acid, Venous    Component Value Date/Time   LATICACIDVEN 0.8 08/28/2015 1702    MICROBIOLOGY: No results found for this or any previous visit (from the past 240 hour(s)).  RADIOLOGY STUDIES/RESULTS: Ct Abdomen Pelvis W Contrast  10/01/2015  CLINICAL DATA:  Abdominal pain and diarrhea EXAM: CT ABDOMEN AND PELVIS WITH CONTRAST TECHNIQUE: Multidetector CT imaging of the abdomen and pelvis was performed using the standard protocol following bolus administration of intravenous contrast. CONTRAST:  ISOVUE-300 IOPAMIDOL (ISOVUE-300) INJECTION 61% COMPARISON:  08/28/2015 FINDINGS:  Lower chest:  No acute findings. Hepatobiliary: No masses or other significant abnormality. Pancreas: No mass, inflammatory changes, or other significant abnormality. Spleen: Within normal limits in size and appearance. Adrenals/Urinary Tract: No masses identified. No evidence of hydronephrosis. Stomach/Bowel: Previously seen small bowel obstruction has resolved in the interval. There remains however evidence of a focal fluid collection which appears to arise from the small bowel. It is similar to that seen on the prior exam it demonstrates some persistent inflammatory change. This is again the hila suspicious for underlying inflammatory bowel disease such as Crohn's. The extra luminal air has resolved in the interval from the prior exam. These surrounding small lymph nodes are again identified and stable. Vascular/Lymphatic: No other pathologically enlarged lymph nodes. No evidence of abdominal aortic aneurysm. Reproductive: No mass or other significant abnormality. Other: None. Musculoskeletal:  No suspicious bone lesions identified. IMPRESSION: Resolution of previously seen small bowel obstruction. There remains however a a small fluid collection with associated phlegmonous changes adjacent to the  bowel loops of the mid to distal small bowel. This again raises suspicion for inflammatory bowel disease. The extra luminal air seen previously has resolved in the interval. No other focal abnormality is seen. Electronically Signed   By: Alcide Clever M.D.   On: 10/01/2015 12:44   Mr Adora Fridge W/o W/cm  09/23/2015  CLINICAL DATA:  Right-sided abdominal pain common nausea and vomiting. Findings suspicious for Crohn's disease on recent CT scan. EXAM: MR ABDOMEN AND PELVIS WITHOUT AND WITH CONTRAST (MR ENTEROGRAPHY) TECHNIQUE: Multiplanar, multisequence MRI of the abdomen and pelvis was performed both before and during bolus administration of intravenous contrast. Negative oral contrast VoLumen was given. CONTRAST:  20mL MULTIHANCE GADOBENATE DIMEGLUMINE 529 MG/ML IV SOLN COMPARISON:  CT scan 08/28/2015 FINDINGS: COMBINED FINDINGS FOR BOTH MR ABDOMEN AND PELVIS Lower chest: The lung bases are grossly clear. No pleural or pericardial effusion. Hepatobiliary: No focal hepatic lesions or intrahepatic biliary dilatation. The gallbladder is normal. No common bile duct dilatation. Pancreas: No mass, inflammation or ductal dilatation. Spleen: Normal size.  No focal lesions. Adrenals/Urinary Tract: The adrenal glands and kidneys are unremarkable. Stomach/Bowel: The stomach and duodenum are unremarkable. The proximal small bowel appears normal. Mild persistent small bowel dilatation in the upper central pelvic region with a persistent area of inflammatory bowel disease with a slightly thickened enhancing bowel wall and subsequent distal decompressed small bowel loops. There is persistent inflammation in the adjacent small bowel mesentery and right small walled-off rim enhancing fluid collection or possibly a diverticulum. The terminal ileum is normal. Findings highly suspicious for Crohn's disease. The colon is unremarkable. Vascular/Lymphatic: No mesenteric or retroperitoneal mass or adenopathy. Small scattered lymph nodes  are stable. The aorta and branch vessels are normal. The major venous structures are patent. Other: No ascites or abdominal wall hernia. The bladder, prostate gland and seminal vesicles are unremarkable. No inguinal mass or adenopathy. Musculoskeletal: No significant bony findings. IMPRESSION: 1. Persistent inflammatory process involving the distal ileum suspicious for Crohn's disease and probable mild or partial small bowel obstruction the with slightly dilated proximal small bowel loops and distal decompressed loops. The terminal ileum is normal. 2. Rim enhancing fluid collection adjacent to the inflamed small bowel in the pelvis, likely a walled-off fluid collection or diverticulum. Persistent inflammation in the adjacent small bowel mesentery. Electronically Signed   By: Rudie Meyer M.D.   On: 09/23/2015 12:54   Mr Leslye Peer Pelvis W/o W/cm  09/23/2015  CLINICAL DATA:  Right-sided abdominal pain common  nausea and vomiting. Findings suspicious for Crohn's disease on recent CT scan. EXAM: MR ABDOMEN AND PELVIS WITHOUT AND WITH CONTRAST (MR ENTEROGRAPHY) TECHNIQUE: Multiplanar, multisequence MRI of the abdomen and pelvis was performed both before and during bolus administration of intravenous contrast. Negative oral contrast VoLumen was given. CONTRAST:  20mL MULTIHANCE GADOBENATE DIMEGLUMINE 529 MG/ML IV SOLN COMPARISON:  CT scan 08/28/2015 FINDINGS: COMBINED FINDINGS FOR BOTH MR ABDOMEN AND PELVIS Lower chest: The lung bases are grossly clear. No pleural or pericardial effusion. Hepatobiliary: No focal hepatic lesions or intrahepatic biliary dilatation. The gallbladder is normal. No common bile duct dilatation. Pancreas: No mass, inflammation or ductal dilatation. Spleen: Normal size.  No focal lesions. Adrenals/Urinary Tract: The adrenal glands and kidneys are unremarkable. Stomach/Bowel: The stomach and duodenum are unremarkable. The proximal small bowel appears normal. Mild persistent small bowel dilatation  in the upper central pelvic region with a persistent area of inflammatory bowel disease with a slightly thickened enhancing bowel wall and subsequent distal decompressed small bowel loops. There is persistent inflammation in the adjacent small bowel mesentery and right small walled-off rim enhancing fluid collection or possibly a diverticulum. The terminal ileum is normal. Findings highly suspicious for Crohn's disease. The colon is unremarkable. Vascular/Lymphatic: No mesenteric or retroperitoneal mass or adenopathy. Small scattered lymph nodes are stable. The aorta and branch vessels are normal. The major venous structures are patent. Other: No ascites or abdominal wall hernia. The bladder, prostate gland and seminal vesicles are unremarkable. No inguinal mass or adenopathy. Musculoskeletal: No significant bony findings. IMPRESSION: 1. Persistent inflammatory process involving the distal ileum suspicious for Crohn's disease and probable mild or partial small bowel obstruction the with slightly dilated proximal small bowel loops and distal decompressed loops. The terminal ileum is normal. 2. Rim enhancing fluid collection adjacent to the inflamed small bowel in the pelvis, likely a walled-off fluid collection or diverticulum. Persistent inflammation in the adjacent small bowel mesentery. Electronically Signed   By: Rudie Meyer M.D.   On: 09/23/2015 12:54     LOS: 6 days   Jeoffrey Massed, MD  [facility] Pager:[phone]  If 7PM-7AM, please contact night-coverage [email or web address] Password [email or web address] 10/07/2015, 12:08 PM

## 2015-10-07 NOTE — Progress Notes (Signed)
5 Days Post-Op  Subjective: Sleeping and not eating much, sticking to mostly full liquids.  Stools are still loose.  Surgical site looks fine.    Objective: Vital signs in last 24 hours: Temp:  [98.1 F (36.7 C)-98.7 F (37.1 C)] 98.1 F (36.7 C) (07/19 0647) Pulse Rate:  [55-70] 55 (07/19 0647) Resp:  [17-18] 17 (07/19 0647) BP: (120-139)/(68-76) 128/68 mmHg (07/19 0647) SpO2:  [96 %-97 %] 96 % (07/19 0647) Last BM Date: 10/06/15 440 PO Afebrile, VSS Labs OK yesterday Intake/Output from previous day: 07/18 0701 - 07/19 0700 In: 440 [P.O.:440] Out: -  Intake/Output this shift:    General appearance: alert, cooperative and no distress Resp: clear to auscultation bilaterally GI: soft , sore, sites look fine, + BS and loose stool .  Over all making good progress  Lab Results:   Recent Labs  10/06/15 0519  WBC 5.3  HGB 12.3*  HCT 36.9*  PLT 210    BMET  Recent Labs  10/06/15 0519  NA 138  K 3.5  CL 102  CO2 32  GLUCOSE 102*  BUN 10  CREATININE 1.09  CALCIUM 8.7*   PT/INR No results for input(s): LABPROT, INR in the last 72 hours.   Recent Labs Lab 10/01/15 1005 10/06/15 0519  AST 13* 13*  ALT 26 24  ALKPHOS 56 43  BILITOT 1.3* 0.3  PROT 7.0 5.8*  ALBUMIN 4.2 3.2*     Lipase     Component Value Date/Time   LIPASE 26 10/01/2015 1005     Studies/Results: No results found.  Medications: . enoxaparin (LOVENOX) injection  40 mg Subcutaneous Q24H  . famotidine  20 mg Oral BID  . predniSONE  10 mg Oral BID WC   . dextrose 5% lactated ringers with KCl 20 mEq/L 50 mL/hr at 10/06/15 1059    Pathology: 1. 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 Small intestine, resection, distal segment of ileum SMALL BOWEL MUCOSA NO EVIDENCE OF MALIGNANCY 3. 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 Derrell Lolling POD 5 Possible Crohn's disease  FEN: soft diet/IV fluids ID:Pre op only DVT: Lovenox   Plan:  Home later today or tomorrow.  i would like him to eat some real food and be sure he is tolerating it well. I will get him set up to have staples out after he comes back from the beach.  i will let him shower today. IV is out due to infiltration, I will leave it out for now.  Add Ibuprofen to PO pain meds.       LOS: 6 days    Anab Vivar 10/07/2015 [phone]

## 2015-10-07 NOTE — Discharge Summary (Signed)
Physician Discharge Summary  Patient ID: Luis Pineda MRN: [id number] DOB/AGE: Apr 06, 1980 35 y.o.  Admit date: 10/01/2015 Discharge date: 10/07/2015  Admission Diagnoses:  Abdominal pain with nausea and vomiting Prior SBO/enteritis with presumed Crohn's disease  Discharge Diagnoses:   MECKEL'S DIVERTICULUM WITH INFLAMMATION AND ABSCESS SBO/enteritis with hospitalization 6/9-6/23/17 - Possible Crohn's disease   Principal Problem:   Meckel's diverticulum perforation Active Problems:   SBO (small bowel obstruction) (HCC)   Abdominal pain   PROCEDURES: S/p Laparoscopic assisted small bowel resection, 10/02/15, Dr. Derrell Lolling   Pathology:  Pathology: 1. 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 Small intestine, resection, distal segment of ileum SMALL BOWEL MUCOSA NO EVIDENCE OF MALIGNANCY 3. Small intestine, resection, proximal segment SMALL BOWEL MUCOSA NO EVIDENCE OF MALIGNANCY  Hospital Course:  Pt seen at [facility] during hospitalization 08/28/15-09/11/15, with an SBO. He has no history of any issues before this, but if you ask he has had loose stools for years, and episodes of distension and fullness after large meals for some time.   Work up by GI consistent with Crohn's disease. He was treated with steroids and antibiotics. Antibiotics were completed and the steroids have been tapered. He had an outpatient colonoscopy on 09/16/15 by Dr. Adela Lank. Findings show: The perianal and digital rectal examinations were normal. - The terminal ileum was deeply intubated (roughly 30cm) and appeared normal. This was biopsied with a cold forceps for histology. - A few small-mouthed diverticula were found in the sigmoid colon. - The exam was otherwise without abnormality on direct and retroflexion views.  MR enterography on 09/23/15 shows: The stomach and duodenum are unremarkable.  The proximal small bowel appears normal. Mild persistent small bowel dilatation in the upper central pelvic region with a persistent area of inflammatory bowel disease with a slightly thickened enhancing bowel wall and subsequent distal decompressed small bowel loops. There is persistent inflammation in the adjacent small bowel mesentery and right small walled-off rim enhancing fluid collection or possibly a diverticulum. The terminal ileum is normal. Findings highly suspicious for Crohn's disease. He called DR. Armbruster today and having 8/10pain on clear liquids, nausea without emesis, and pain is similar to what he had on prior admission. He was scheduled to see Dr. Maisie Fus next week for evaluation and possible resection of the constricted area of small bowel. With his current symptoms he is being admitted for pain control and treatment of his disease.  Work up in the ED today shows: BP is up, he is afebrile, VSS. Labs show elevated WBC 11.2, labs are otherwise normal. Repeat CT today shows: Resolution of previously seen small bowel obstruction. There remains however a a small fluid collection with associated phlegmonous changes adjacent to the bowel loops of the mid to distal small bowel. This again raises suspicion for inflammatory bowel disease. The extra luminal air seen previously has resolved in the interval.  He was seen and evaluated in the ED day of admit by Dr. Myrtie Neither and Dr. Derrell Lolling.  It was Dr. Jacinto Halim opinion that Crohn's was a likely diagnosis but needed to consider other possibilities including Meckel's diverticulum, lymphoma, unknown neoplasia, or foreign body perforation.  He recommended surgery diagnostic laparoscopy, and small bowel resection as an alternative to current medical management. This would also give a tissue diagnosis of the patients true pathology.   Pt finally opted for surgery and was taken to the OR the following day. He underwent the  surgery as noted above.  Post  op he had some ileus,but over all made good progress.  Sites all look good.  Pathology revealed a Meckel's diverticulum.  His diet has been advanced, his steroids are being tapered and by lunch he was ready to go home.  He is going to the beach next week so we have arranged for him to get his staples out on 7/31 and follow up with Dr. Derrell Lolling on 10/23/15.    Condition on d/c:  Improved   CBC Latest Ref Rng 10/06/2015 10/04/2015 10/03/2015  WBC 4.0 - 10.5 K/uL 5.3 6.5 13.2(H)  Hemoglobin 13.0 - 17.0 g/dL 12.3(L) 12.1(L) 13.4  Hematocrit 39.0 - 52.0 % 36.9(L) 37.2(L) 40.4  Platelets 150 - 400 K/uL 210 199 225   CMP Latest Ref Rng 10/06/2015 10/04/2015 10/03/2015  Glucose 65 - 99 mg/dL 767(M) 094(B) 096(G)  BUN 6 - 20 mg/dL 10 6 8   Creatinine 0.61 - 1.24 mg/dL 8.36 6.29 4.76  Sodium 135 - 145 mmol/L 138 139 138  Potassium 3.5 - 5.1 mmol/L 3.5 3.9 4.1  Chloride 101 - 111 mmol/L 102 103 103  CO2 22 - 32 mmol/L 32 32 28  Calcium 8.9 - 10.3 mg/dL 5.4(Y) 5.0(P) 5.4(S)  Total Protein 6.5 - 8.1 g/dL 5.6(C) - -  Total Bilirubin 0.3 - 1.2 mg/dL 0.3 - -  Alkaline Phos 38 - 126 U/L 43 - -  AST 15 - 41 U/L 13(L) - -  ALT 17 - 63 U/L 24 - -     Disposition: 01-Home or Self Care     Medication List    STOP taking these medications        dicyclomine 20 MG tablet  Commonly known as:  BENTYL      TAKE these medications        acetaminophen 325 MG tablet  Commonly known as:  TYLENOL  You can take plain Tylenol or Ibuprofen for pain as your first line medicine.  You cannot take more than 4000 of tylenol per day and there is one in each of your prescribed pain pills.  325 mg in each so make sure of your dosing.     HYDROcodone-acetaminophen 5-325 MG tablet  Commonly known as:  NORCO/VICODIN  Take 1-2 tablets by mouth every 4 (four) hours as needed for moderate pain.     ibuprofen 200 MG tablet  Commonly known as:  ADVIL,MOTRIN  You can take 2-3 tablets every 6 hours as needed for pain.  Use this  first then Vicodin     predniSONE 5 MG tablet  Commonly known as:  DELTASONE  Take 2 tablets every 12 hours for 5 day.  Then take 2 tablets daily for 5 days.  Then take 1 tablet daily for 5 days.  After that you are done with this Medication.     ranitidine 150 MG capsule  Commonly known as:  ZANTAC  Take 150 mg by mouth daily as needed for heartburn.       Follow-up Information    Follow up with Ernestene Mention, MD On 10/19/2015.   Specialty:  General Surgery   Why:  Nurse visit at 10 AM, be at he office 30 minutes early for check in.  Staple removal at this visit.   Contact information:   [address] [place] [address] [phone]       Follow up with Ernestene Mention, MD On 10/23/2015.   Specialty:  General Surgery   Why:  Your appointment is at 1:45, be at the office 30 minutes early for check in.     Contact information:   [address] [place] [address] [phone]       Follow up with Thora Lance, MD.   Specialty:  Family Medicine   Why:  CAll and arrange a follow up.   Contact information:   [address] [place] [address] [phone]       Signed: Sherrie George 10/07/2015, 5:44 PM

## 2015-10-13 ENCOUNTER — Encounter: Payer: 59 | Admitting: Gastroenterology

## 2015-10-20 ENCOUNTER — Other Ambulatory Visit: Payer: Self-pay | Admitting: Surgery

## 2015-10-20 DIAGNOSIS — Z8719 Personal history of other diseases of the digestive system: Secondary | ICD-10-CM

## 2015-10-21 ENCOUNTER — Ambulatory Visit
Admission: RE | Admit: 2015-10-21 | Discharge: 2015-10-21 | Disposition: A | Discharge status: Home / Self Care | Payer: 59 | Source: Ambulatory Visit | Attending: Surgery | Admitting: Surgery

## 2015-10-21 DIAGNOSIS — Z8719 Personal history of other diseases of the digestive system: Secondary | ICD-10-CM

## 2015-10-21 MED ORDER — IOPAMIDOL (ISOVUE-300) INJECTION 61%
100.0000 mL | Freq: Once | INTRAVENOUS | Status: AC | PRN
  Administered 2015-10-21: 100 mL via INTRAVENOUS

## 2016-07-29 DIAGNOSIS — R42 Dizziness and giddiness: Secondary | ICD-10-CM | POA: Diagnosis not present

## 2016-07-29 DIAGNOSIS — Z0000 Encounter for general adult medical examination without abnormal findings: Secondary | ICD-10-CM | POA: Diagnosis not present

## 2016-07-29 DIAGNOSIS — Z13220 Encounter for screening for lipoid disorders: Secondary | ICD-10-CM | POA: Diagnosis not present

## 2017-05-20 DIAGNOSIS — R509 Fever, unspecified: Secondary | ICD-10-CM | POA: Diagnosis not present

## 2017-05-20 DIAGNOSIS — J0100 Acute maxillary sinusitis, unspecified: Secondary | ICD-10-CM | POA: Diagnosis not present

## 2017-09-01 DIAGNOSIS — R109 Unspecified abdominal pain: Secondary | ICD-10-CM | POA: Diagnosis not present

## 2017-09-01 DIAGNOSIS — Z13220 Encounter for screening for lipoid disorders: Secondary | ICD-10-CM | POA: Diagnosis not present

## 2017-09-01 DIAGNOSIS — Z0000 Encounter for general adult medical examination without abnormal findings: Secondary | ICD-10-CM | POA: Diagnosis not present

## 2022-02-14 ENCOUNTER — Ambulatory Visit
Admission: EM | Admit: 2022-02-14 | Discharge: 2022-02-14 | Disposition: A | Discharge status: Home / Self Care | Payer: 59 | Attending: Internal Medicine | Admitting: Internal Medicine

## 2022-02-14 ENCOUNTER — Encounter: Payer: Self-pay | Admitting: Emergency Medicine

## 2022-02-14 ENCOUNTER — Other Ambulatory Visit: Payer: Self-pay

## 2022-02-14 ENCOUNTER — Ambulatory Visit (INDEPENDENT_AMBULATORY_CARE_PROVIDER_SITE_OTHER): Payer: 59

## 2022-02-14 DIAGNOSIS — B349 Viral infection, unspecified: Secondary | ICD-10-CM | POA: Diagnosis not present

## 2022-02-14 DIAGNOSIS — R059 Cough, unspecified: Secondary | ICD-10-CM

## 2022-02-14 DIAGNOSIS — R051 Acute cough: Secondary | ICD-10-CM | POA: Insufficient documentation

## 2022-02-14 DIAGNOSIS — R0902 Hypoxemia: Secondary | ICD-10-CM | POA: Insufficient documentation

## 2022-02-14 DIAGNOSIS — R7981 Abnormal blood-gas level: Secondary | ICD-10-CM | POA: Insufficient documentation

## 2022-02-14 DIAGNOSIS — Z1152 Encounter for screening for COVID-19: Secondary | ICD-10-CM | POA: Insufficient documentation

## 2022-02-14 LAB — RESP PANEL BY RT-PCR (FLU A&B, COVID) ARPGX2
Influenza A by PCR: POSITIVE — AB
Influenza B by PCR: NEGATIVE
SARS Coronavirus 2 by RT PCR: NEGATIVE

## 2022-02-14 MED ORDER — METHYLPREDNISOLONE SODIUM SUCC 125 MG IJ SOLR
80.0000 mg | Freq: Once | INTRAMUSCULAR | Status: AC
  Administered 2022-02-14: 80 mg via INTRAMUSCULAR

## 2022-02-14 MED ORDER — PREDNISONE 20 MG PO TABS: 40.0000 mg | ORAL_TABLET | Freq: Every day | ORAL | 0 refills | Status: AC

## 2022-02-14 MED ORDER — BENZONATATE 100 MG PO CAPS: 100.0000 mg | ORAL_CAPSULE | Freq: Three times a day (TID) | ORAL | 0 refills | Status: AC | PRN

## 2022-02-14 MED ORDER — ALBUTEROL SULFATE (2.5 MG/3ML) 0.083% IN NEBU
2.5000 mg | INHALATION_SOLUTION | Freq: Once | RESPIRATORY_TRACT | Status: AC
  Administered 2022-02-14: 2.5 mg via RESPIRATORY_TRACT

## 2022-02-14 MED ORDER — PROMETHAZINE-DM 6.25-15 MG/5ML PO SYRP: 5.0000 mL | ORAL_SOLUTION | Freq: Every evening | ORAL | 0 refills | Status: AC | PRN

## 2022-02-14 NOTE — ED Triage Notes (Signed)
Pt here for cough and body aches x 4 days 

## 2022-02-14 NOTE — ED Provider Notes (Addendum)
[facility]    CSN: [id number] Arrival date & time: 02/14/22  0817      History   Chief Complaint Chief Complaint  Patient presents with   Cough    HPI Luis Pineda is a 41 y.o. male.   Patient presents with dry cough, generalized body aches, chills that started about 4 days ago.  Patient denies nasal congestion, runny nose, sore throat, ear pain, nausea, vomiting, diarrhea or abdominal pain.  He denies any obvious known sick contacts but did attend Thanksgiving with several different family members.  Denies any known fevers at home.  Patient has taken DayQuil and NyQuil with minimal improvement of symptoms.  Denies history of asthma or COPD and patient is not a smoker.  Patient reports that he had been having some chest pain with inspiration only.  Denies any shortness of breath.   Cough   Past Medical History:  Diagnosis Date   Meckel's diverticulum perforation 10/07/2015   SBO (small bowel obstruction) ([place]) 08/2015    Patient Active Problem List   Diagnosis Date Noted   Meckel's diverticulum perforation 10/07/2015   Abdominal pain 10/01/2015   SBO (small bowel obstruction) ([place]) 08/28/2015   Generalized abdominal pain 08/28/2015   RLQ abdominal pain 08/28/2015   Nausea and vomiting 08/28/2015   Enteritis 08/28/2015    Past Surgical History:  Procedure Laterality Date   ARTHROSCOPY KNEE W/ DRILLING Right    COLONOSCOPY  09/16/15   normal into terminal ileum.  TI biopsies: normal SB.     LAPAROSCOPIC SMALL BOWEL RESECTION N/A 10/02/2015   Procedure: LAPAROSCOPIC SMALL BOWEL RESECTION ;  Surgeon: Fanny Skates, MD;  Location: [facility];  Service: General;  Laterality: N/A;   LASIK         Home Medications    Prior to Admission medications   Medication Sig Start Date End Date Taking? Authorizing Provider  benzonatate (TESSALON) 100 MG capsule Take 1 capsule (100 mg total) by mouth every 8 (eight) hours as needed for cough. 02/14/22  Yes , Hildred Alamin  E, FNP  predniSONE (DELTASONE) 20 MG tablet Take 2 tablets (40 mg total) by mouth daily for 5 days. 02/14/22 02/19/22 Yes , Michele Rockers, FNP  promethazine-dextromethorphan (PROMETHAZINE-DM) 6.25-15 MG/5ML syrup Take 5 mLs by mouth at bedtime as needed for cough. 02/14/22  Yes , Hildred Alamin E, FNP  acetaminophen (TYLENOL) 325 MG tablet You can take plain Tylenol or Ibuprofen for pain as your first line medicine.  You cannot take more than 4000 of tylenol per day and there is one in each of your prescribed pain pills.  325 mg in each so make sure of your dosing. 10/07/15   Earnstine Regal, PA-C  HYDROcodone-acetaminophen (NORCO/VICODIN) 5-325 MG tablet Take 1-2 tablets by mouth every 4 (four) hours as needed for moderate pain. 10/07/15   Earnstine Regal, PA-C  ibuprofen (ADVIL,MOTRIN) 200 MG tablet You can take 2-3 tablets every 6 hours as needed for pain.  Use this first then Vicodin 10/07/15   Earnstine Regal, PA-C  ranitidine (ZANTAC) 150 MG capsule Take 150 mg by mouth daily as needed for heartburn.    [provider]    Family History Family History  Problem Relation Age of Onset   GI problems Other    Breast cancer Mother    Melanoma Father     Social History Social History   Tobacco Use   Smoking status: Never   Smokeless tobacco: Never  Substance Use Topics   Alcohol use:  Yes    Comment: OCCASIONAL   Drug use: No     Allergies   Patient has no known allergies.   Review of Systems Review of Systems Per HPI  Physical Exam Triage Vital Signs ED Triage Vitals  Enc Vitals Group     BP 02/14/22 0835 136/89     Pulse Rate 02/14/22 0835 92     Resp 02/14/22 0835 20     Temp 02/14/22 0835 99 F (37.2 C)     Temp Source 02/14/22 0835 Oral     SpO2 02/14/22 0835 95 %     Weight --      Height --      Head Circumference --      Peak Flow --      Pain Score 02/14/22 0842 4     Pain Loc --      Pain Edu? --      Excl. in [place]? --    No data found.  Updated  Vital Signs BP 136/89 (BP Location: Left Arm)   Pulse 92   Temp 99 F (37.2 C) (Oral)   Resp 20   SpO2 95%   Visual Acuity Right Eye Distance:   Left Eye Distance:   Bilateral Distance:    Right Eye Near:   Left Eye Near:    Bilateral Near:     Physical Exam Constitutional:      General: He is not in acute distress.    Appearance: Normal appearance. He is not toxic-appearing or diaphoretic.  HENT:     Head: Normocephalic and atraumatic.     Right Ear: Tympanic membrane and ear canal normal.     Left Ear: Tympanic membrane and ear canal normal.     Nose: Congestion present.     Mouth/Throat:     Mouth: Mucous membranes are moist.     Pharynx: No posterior oropharyngeal erythema.  Eyes:     Extraocular Movements: Extraocular movements intact.     Conjunctiva/sclera: Conjunctivae normal.     Pupils: Pupils are equal, round, and reactive to light.  Cardiovascular:     Rate and Rhythm: Normal rate and regular rhythm.     Pulses: Normal pulses.     Heart sounds: Normal heart sounds.  Pulmonary:     Effort: Pulmonary effort is normal. No respiratory distress.     Breath sounds: Normal breath sounds. No stridor. No wheezing, rhonchi or rales.  Abdominal:     General: Abdomen is flat. Bowel sounds are normal.     Palpations: Abdomen is soft.  Musculoskeletal:        General: Normal range of motion.     Cervical back: Normal range of motion.  Skin:    General: Skin is warm and dry.  Neurological:     General: No focal deficit present.     Mental Status: He is alert and oriented to person, place, and time. Mental status is at baseline.  Psychiatric:        Mood and Affect: Mood normal.        Behavior: Behavior normal.      UC Treatments / Results  Labs (all labs ordered are listed, but only abnormal results are displayed) Labs Reviewed  RESP PANEL BY RT-PCR (FLU A&B, COVID) [id number]    EKG   Radiology DG Chest 2 View  Result Date: 02/14/2022 CLINICAL DATA:   Cough and body aches. EXAM: CHEST - 2 VIEW COMPARISON:  None Available. FINDINGS: Trachea is midline.  Heart size normal. Lungs are clear. No pleural fluid. IMPRESSION: Negative. Electronically Signed   By: Leanna Battles M.D.   On: 02/14/2022 09:35    Procedures Procedures (including critical care time)  Medications Ordered in UC Medications  albuterol (PROVENTIL) (2.5 MG/3ML) 0.083% nebulizer solution 2.5 mg (2.5 mg Nebulization Given 02/14/22 0859)  methylPREDNISolone sodium succinate (SOLU-MEDROL) 125 mg/2 mL injection 80 mg (80 mg Intramuscular Given 02/14/22 0859)    Initial Impression / Assessment and Plan / UC Course  I have reviewed the triage vital signs and the nursing notes.  Pertinent labs & imaging results that were available during my care of the patient were reviewed by me and considered in my medical decision making (see chart for details).     Patient presents with symptoms likely from a viral respiratory infection. Differential includes bacterial pneumonia, Covid 19, flu, RSV.  Patient's oxygen saturation was ranging from 90 to 93%.  It did increase slightly to 97% when patient would take deep breaths.  Given this, patient was advised to go to the emergency department for further evaluation and management but refused to go to the ER.  Risks associated with not going to the ER were discussed with patient.  Patient voiced understanding and wished to have outpatient treatment in urgent care.  Educated patient on limited resources and treatment here in urgent care.  Patient voiced understanding.  Albuterol nebulizer treatment and IM steroid were administered with no improvement in oxygen saturation.  Chest x-ray completed that was negative for any acute cardiopulmonary process.  Will treat with prednisone steroid burst as well.  Patient advised to start taking this tomorrow given that he received IM steroid today in urgent care.  Also prescribed patient 2 different cough  medications.  Advised patient that Promethazine DM can cause drowsiness.  COVID and flu test pending. Patient has had a small bowel resection in 2017 but steroid therapy should be safe especially since benefits will outweigh risks given patient's oxygen saturation.   Patient was advised to get a pulse oximeter from the pharmacy given that he is declining to go to the ER and to monitor at home.  He was advised to go straight to the ER if it continues to sustain below 93% despite current treatment plan or if he develops any new or worsening symptoms.  Strict ER precautions given. Patient voiced understanding.  Final Clinical Impressions(s) / UC Diagnoses   Final diagnoses:  Viral illness  Acute cough  Low oxygen saturation     Discharge Instructions      It appears that you have a viral illness that is causing your symptoms.  Your COVID and flu test are pending.  We will call if they are positive.  I have prescribed you prednisone steroid pills which you will start taking tomorrow given that you received a steroid shot today in urgent care.  I have also prescribed you 2 different cough medications.  Benzonatate is to be taken during the day as it does not cause drowsiness.  Promethazine DM is taken at night prior to bedtime as it can cause drowsiness.  Please get a pulse oximeter from the pharmacy when you pick up your medications.  If you cannot find it, please ask the pharmacist where it is located.  If your oxygen is continuously below 93% despite current treatment plan or if you develop any shortness of breath or worsening symptoms, please go straight to the emergency department.     ED Prescriptions  Medication Sig Dispense Auth. Provider   predniSONE (DELTASONE) 20 MG tablet Take 2 tablets (40 mg total) by mouth daily for 5 days. 10 tablet [place], [place] E, [place]   benzonatate (TESSALON) 100 MG capsule Take 1 capsule (100 mg total) by mouth every 8 (eight) hours as needed for cough. 21  capsule [place], [place] E, [place]   promethazine-dextromethorphan (PROMETHAZINE-DM) 6.25-15 MG/5ML syrup Take 5 mLs by mouth at bedtime as needed for cough. 118 mL Gustavus Bryant, [place]      I have reviewed the PDMP during this encounter.   Gustavus Bryant, [place] 02/14/22 1007    [address], [place] 02/14/22 1008

## 2022-02-14 NOTE — Discharge Instructions (Signed)
It appears that you have a viral illness that is causing your symptoms.  Your COVID and flu test are pending.  We will call if they are positive.  I have prescribed you prednisone steroid pills which you will start taking tomorrow given that you received a steroid shot today in urgent care.  I have also prescribed you 2 different cough medications.  Benzonatate is to be taken during the day as it does not cause drowsiness.  Promethazine DM is taken at night prior to bedtime as it can cause drowsiness.  Please get a pulse oximeter from the pharmacy when you pick up your medications.  If you cannot find it, please ask the pharmacist where it is located.  If your oxygen is continuously below 93% despite current treatment plan or if you develop any shortness of breath or worsening symptoms, please go straight to the emergency department.

## 2022-12-02 ENCOUNTER — Encounter: Payer: Self-pay | Admitting: *Deleted

## 2022-12-02 ENCOUNTER — Ambulatory Visit
Admission: EM | Admit: 2022-12-02 | Discharge: 2022-12-02 | Disposition: A | Discharge status: Home / Self Care | Payer: 59 | Attending: Physician Assistant | Admitting: Physician Assistant

## 2022-12-02 ENCOUNTER — Other Ambulatory Visit: Payer: Self-pay

## 2022-12-02 DIAGNOSIS — L309 Dermatitis, unspecified: Secondary | ICD-10-CM | POA: Diagnosis not present

## 2022-12-02 MED ORDER — DEXAMETHASONE SODIUM PHOSPHATE 10 MG/ML IJ SOLN
10.0000 mg | Freq: Once | INTRAMUSCULAR | Status: AC
  Administered 2022-12-02: 10 mg via INTRAMUSCULAR

## 2022-12-02 NOTE — ED Provider Notes (Signed)
[facility]    CSN: [id number] Arrival date & time: 12/02/22  0936      History   Chief Complaint Chief Complaint  Patient presents with   Rash    HPI Luis Pineda is a 42 y.o. male.   Patient here today for evaluation of a rash primarily to his left arm that started 2 weeks ago.  He states that over the last few days he started to have more lesions develop to bilateral upper legs and hand.  He has not any fever.  He denies any shortness of breath.  He does report rash is itchy.  He has not had improvement with Benadryl or topical hydrocortisone.  The history is provided by the patient.  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  TI biopsies: normal SB.     LAPAROSCOPIC SMALL BOWEL RESECTION N/A 10/02/2015   Procedure: LAPAROSCOPIC SMALL BOWEL RESECTION ;  Surgeon: Claud Kelp, MD;  Location: [facility];  Service: General;  Laterality: N/A;   LASIK         Home Medications    Prior to Admission medications   Medication Sig Start Date End Date Taking? Authorizing Provider  acetaminophen (TYLENOL) 325 MG tablet You can take plain Tylenol or Ibuprofen for pain as your first line medicine.  You cannot take more than 4000 of tylenol per day and there is one in each of your prescribed pain pills.  325 mg in each so make sure of your dosing. 10/07/15   Sherrie George, PA-C  benzonatate (TESSALON) 100 MG capsule Take 1 capsule (100 mg total) by mouth every 8 (eight) hours as needed for cough. 02/14/22   Gustavus Bryant, FNP  HYDROcodone-acetaminophen (NORCO/VICODIN) 5-325 MG tablet Take 1-2 tablets by mouth every 4 (four) hours as needed for moderate pain. 10/07/15   Sherrie George, PA-C  ibuprofen (ADVIL,MOTRIN) 200 MG tablet You can take 2-3 tablets every 6 hours as needed for pain.  Use this first then Vicodin 10/07/15   Sherrie George, PA-C  promethazine-dextromethorphan (PROMETHAZINE-DM) 6.25-15 MG/5ML syrup Take 5 mLs by mouth at bedtime as needed for cough. 02/14/22   Gustavus Bryant, FNP  ranitidine (ZANTAC) 150 MG capsule Take 150 mg by mouth daily as needed for heartburn.    [provider]    Family History Family History  Problem Relation Age of Onset   GI problems Other    Breast cancer Mother    Melanoma Father     Social History Social History   Tobacco Use   Smoking status: Never   Smokeless tobacco: Never  Vaping Use   Vaping status: Never Used  Substance Use Topics   Alcohol use: Yes    Comment: OCCASIONAL   Drug use: No     Allergies   Patient has no known allergies.   Review of Systems  Review of Systems  Constitutional:  Negative for chills and fever.  HENT:  Negative for trouble swallowing.   Eyes:  Negative for discharge and redness.  Respiratory:  Negative for shortness of breath.   Skin:  Positive for rash. Negative for color change.  Neurological:  Negative for numbness.     Physical Exam Triage Vital Signs ED Triage Vitals  Encounter Vitals Group     BP 12/02/22 0958 119/80     Systolic BP Percentile --      Diastolic BP Percentile --      Pulse Rate 12/02/22 0958 61     Resp 12/02/22 0958 16     Temp 12/02/22 0958 98.3 F (36.8 C)     Temp Source 12/02/22 0958 Oral     SpO2 12/02/22 0958 96 %     Weight --      Height --      Head Circumference --      Peak Flow --       Pain Score 12/02/22 0956 0     Pain Loc --      Pain Education --      Exclude from Growth Chart --    No data found.  Updated Vital Signs BP 119/80 (BP Location: Left Arm)   Pulse 61   Temp 98.3 F (36.8 C) (Oral)   Resp 16   SpO2 96%     Physical Exam Vitals and nursing note reviewed.  Constitutional:      General: He is not in acute distress.    Appearance: Normal appearance. He is not ill-appearing.  HENT:     Head: Normocephalic and atraumatic.  Eyes:     Conjunctiva/sclera: Conjunctivae normal.  Cardiovascular:     Rate and Rhythm: Normal rate.  Pulmonary:     Effort: Pulmonary effort is normal. No respiratory distress.  Skin:    Comments: Scattered vesicular lesions with erythematous base to left forearm, in clusters and somewhat linear pattern, and few satellite lesions to left hand, bilateral upper legs  Neurological:     Mental Status: He is alert.  Psychiatric:        Mood and Affect: Mood normal.        Behavior: Behavior normal.        Thought Content: Thought content normal.      UC Treatments / Results  Labs (all labs ordered are listed, but only abnormal results are displayed) Labs Reviewed - No data to display  EKG   Radiology No results found.  Procedures Procedures (including critical care time)  Medications Ordered in UC Medications  dexamethasone (DECADRON) injection 10 mg (has no administration in time range)    Initial Impression / Assessment and Plan / UC Course  I have reviewed the triage vital signs and the nursing notes.  Pertinent labs & imaging results that were available during my care of the patient were reviewed by me and considered in my medical decision making (see chart for details).    Suspect poison ivy dermatitis.  Will treat with Decadron injection in office and recommended follow-up if no gradual improvement or with any further concerns.  Patient expresses understanding.  Final Clinical Impressions(s) / UC  Diagnoses   Final diagnoses:  Dermatitis   Discharge Instructions   None    ED Prescriptions   None    PDMP not reviewed this encounter.   Tomi Bamberger, PA-C 12/02/22 1143

## 2022-12-02 NOTE — ED Triage Notes (Addendum)
Itchy rash x 2 weeks to extremities. Spreading. Used hydrocortisone cream without relief

## 2023-03-22 ENCOUNTER — Ambulatory Visit (INDEPENDENT_AMBULATORY_CARE_PROVIDER_SITE_OTHER): Payer: Self-pay

## 2023-03-22 ENCOUNTER — Ambulatory Visit
Admission: EM | Admit: 2023-03-22 | Discharge: 2023-03-22 | Disposition: A | Discharge status: Home / Self Care | Payer: Self-pay | Attending: Physician Assistant | Admitting: Physician Assistant

## 2023-03-22 ENCOUNTER — Encounter: Payer: Self-pay | Admitting: Emergency Medicine

## 2023-03-22 ENCOUNTER — Other Ambulatory Visit: Payer: Self-pay

## 2023-03-22 DIAGNOSIS — J40 Bronchitis, not specified as acute or chronic: Secondary | ICD-10-CM

## 2023-03-22 DIAGNOSIS — J329 Chronic sinusitis, unspecified: Secondary | ICD-10-CM

## 2023-03-22 MED ORDER — AMOXICILLIN-POT CLAVULANATE 875-125 MG PO TABS: 1.0000 | ORAL_TABLET | Freq: Two times a day (BID) | ORAL | 0 refills | Status: AC

## 2023-03-22 MED ORDER — PREDNISONE 20 MG PO TABS: 40.0000 mg | ORAL_TABLET | Freq: Every day | ORAL | 0 refills | Status: AC

## 2023-03-22 NOTE — ED Triage Notes (Signed)
 Pt here cough and congestion x 8 days that seems to be getting more severe; per pt his children had PNA

## 2023-03-26 ENCOUNTER — Encounter: Payer: Self-pay | Admitting: Physician Assistant

## 2023-03-26 NOTE — ED Provider Notes (Signed)
 [facility]    CSN: [id number] Arrival date & time: 03/22/23  1112      History   Chief Complaint Chief Complaint  Patient presents with   Cough    HPI Luis Pineda is a 43 y.o. male.   Patient here today for evaluation of cough and congestion has had for 8 days.  He states symptoms seem to be worsening slightly improving.  She has not had fever.  He reports his children have been diagnosed with pneumonia recently.  The history is provided by the patient.  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  TI biopsies: normal SB.     LAPAROSCOPIC SMALL BOWEL RESECTION N/A 10/02/2015   Procedure: LAPAROSCOPIC SMALL BOWEL RESECTION ;  Surgeon: Elon Pacini, MD;  Location: [facility];  Service: General;  Laterality: N/A;   LASIK         Home Medications    Prior to Admission medications   Medication Sig Start Date End Date Taking? Authorizing Provider  amoxicillin -clavulanate (AUGMENTIN ) 875-125 MG tablet Take 1 tablet by mouth every 12 (twelve) hours. 03/22/23  Yes Billy Asberry FALCON, PA-C  predniSONE  (DELTASONE ) 20 MG tablet Take 2 tablets (40 mg total) by mouth daily with breakfast for 5 days. 03/22/23 03/27/23 Yes Billy Asberry FALCON, PA-C  acetaminophen  (TYLENOL ) 325 MG tablet You can take  plain Tylenol  or Ibuprofen  for pain as your first line medicine.  You cannot take more than 4000 of tylenol  per day and there is one in each of your prescribed pain pills.  325 mg in each so make sure of your dosing. 10/07/15   Tonnie George, PA-C  benzonatate  (TESSALON ) 100 MG capsule Take 1 capsule (100 mg total) by mouth every 8 (eight) hours as needed for cough. 02/14/22   Hazen Darryle BRAVO, FNP  HYDROcodone -acetaminophen  (NORCO/VICODIN) 5-325 MG tablet Take 1-2 tablets by mouth every 4 (four) hours as needed for moderate pain. Patient not taking: Reported on 03/22/2023 10/07/15   Tonnie George, PA-C  ibuprofen  (ADVIL ,MOTRIN ) 200 MG tablet You can take 2-3 tablets every 6 hours as needed for pain.  Use this first then Vicodin 10/07/15   Tonnie George, PA-C  promethazine -dextromethorphan (PROMETHAZINE -DM) 6.25-15 MG/5ML syrup Take 5 mLs by mouth at bedtime as needed for cough. 02/14/22   Hazen Darryle BRAVO, FNP  ranitidine (ZANTAC) 150 MG capsule Take 150 mg by mouth daily as needed for heartburn.    [provider]    Family History Family History  Problem Relation Age of Onset   GI problems Other    Breast cancer Mother    Melanoma Father     Social History Social History   Tobacco Use   Smoking status: Never   Smokeless tobacco: Never  Vaping Use   Vaping  status: Never Used  Substance Use Topics   Alcohol use: Yes    Comment: OCCASIONAL   Drug use: No     Allergies   Patient has no known allergies.   Review of Systems Review of Systems  Constitutional:  Positive for chills. Negative for fever.  HENT:  Positive for congestion. Negative for ear pain and sore throat.   Eyes:  Negative for discharge and redness.  Respiratory:  Positive for cough. Negative for shortness of breath.   Gastrointestinal:  Negative for abdominal pain, nausea and vomiting.     Physical Exam Triage Vital Signs ED Triage Vitals [03/22/23 1158]  Encounter Vitals Group     BP 133/87      Systolic BP Percentile      Diastolic BP Percentile      Pulse Rate 78     Resp 18     Temp 97.8 F (36.6 C)     Temp Source Oral     SpO2 97 %     Weight      Height      Head Circumference      Peak Flow      Pain Score 3     Pain Loc      Pain Education      Exclude from Growth Chart    No data found.  Updated Vital Signs BP 133/87 (BP Location: Left Arm)   Pulse 78   Temp 97.8 F (36.6 C) (Oral)   Resp 18   SpO2 97%   Visual Acuity Right Eye Distance:   Left Eye Distance:   Bilateral Distance:    Right Eye Near:   Left Eye Near:    Bilateral Near:     Physical Exam Vitals and nursing note reviewed.  Constitutional:      General: He is not in acute distress.    Appearance: He is well-developed. He is not ill-appearing.  HENT:     Head: Normocephalic and atraumatic.     Right Ear: Tympanic membrane normal.     Left Ear: Tympanic membrane normal.     Nose: Congestion present.     Mouth/Throat:     Mouth: Mucous membranes are moist.     Pharynx: No oropharyngeal exudate or posterior oropharyngeal erythema.     Tonsils: 0 on the right. 0 on the left.  Eyes:     Conjunctiva/sclera: Conjunctivae normal.  Cardiovascular:     Rate and Rhythm: Normal rate and regular rhythm.     Heart sounds: Normal heart sounds. No murmur heard. Pulmonary:     Effort: Pulmonary effort is normal. No respiratory distress.     Breath sounds: Normal breath sounds. No wheezing, rhonchi or rales.  Skin:    General: Skin is warm and dry.  Neurological:     Mental Status: He is alert.  Psychiatric:        Mood and Affect: Mood normal.        Behavior: Behavior normal.      UC Treatments / Results  Labs (all labs ordered are listed, but only abnormal results are displayed) Labs Reviewed - No data to display  EKG   Radiology No results found.  Procedures Procedures (including critical care time)  Medications Ordered in UC Medications - No data to  display  Initial Impression / Assessment and Plan / UC Course  I have reviewed the triage vital signs and the nursing notes.  Pertinent labs & imaging results that were available  during my care of the patient were reviewed by me and considered in my medical decision making (see chart for details).    Chest x-ray without acute findings.  Suspect likely sinobronchitis and will treat with Augmentin  and steroid burst.  Recommended follow-up if no gradual improvement or with any further concerns.  Final Clinical Impressions(s) / UC Diagnoses   Final diagnoses:  Sinobronchitis   Discharge Instructions   None    ED Prescriptions     Medication Sig Dispense Auth. Provider   predniSONE  (DELTASONE ) 20 MG tablet Take 2 tablets (40 mg total) by mouth daily with breakfast for 5 days. 10 tablet Billy Stabs F, PA-C   amoxicillin -clavulanate (AUGMENTIN ) 875-125 MG tablet Take 1 tablet by mouth every 12 (twelve) hours. 14 tablet Billy Stabs FALCON, PA-C      PDMP not reviewed this encounter.   Billy Stabs FALCON, PA-C 03/26/23 1221
